# Patient Record
Sex: Male | Born: 1955
Health system: Southern US, Community
[De-identification: ages and names within clinical notes are randomized; demographics above are authoritative.]

## PROBLEM LIST (undated history)

## (undated) DIAGNOSIS — K219 Gastro-esophageal reflux disease without esophagitis: Secondary | ICD-10-CM

## (undated) DIAGNOSIS — I10 Essential (primary) hypertension: Secondary | ICD-10-CM

## (undated) DIAGNOSIS — K4090 Unilateral inguinal hernia, without obstruction or gangrene, not specified as recurrent: Secondary | ICD-10-CM

## (undated) DIAGNOSIS — Z87442 Personal history of urinary calculi: Secondary | ICD-10-CM

## (undated) DIAGNOSIS — E7800 Pure hypercholesterolemia, unspecified: Secondary | ICD-10-CM

## (undated) DIAGNOSIS — F419 Anxiety disorder, unspecified: Secondary | ICD-10-CM

## (undated) HISTORY — PX: TONSILLECTOMY: SUR1361

## (undated) HISTORY — PX: INGUINAL HERNIA REPAIR: SUR1180

## (undated) HISTORY — PX: SHOULDER SURGERY: SHX246

## (undated) HISTORY — PX: NECK SURGERY: SHX720

---

## 1999-07-15 ENCOUNTER — Encounter (HOSPITAL_BASED_OUTPATIENT_CLINIC_OR_DEPARTMENT_OTHER): Payer: Self-pay | Admitting: General Surgery

## 1999-07-16 ENCOUNTER — Ambulatory Visit (HOSPITAL_COMMUNITY): Admission: RE | Admit: 1999-07-16 | Discharge: 1999-07-16 | Payer: Self-pay | Admitting: General Surgery

## 2000-11-01 ENCOUNTER — Encounter: Payer: Self-pay | Admitting: Internal Medicine

## 2000-11-01 ENCOUNTER — Encounter: Admission: RE | Admit: 2000-11-01 | Discharge: 2000-11-01 | Payer: Self-pay | Admitting: Internal Medicine

## 2000-11-15 ENCOUNTER — Encounter: Payer: Self-pay | Admitting: Neurosurgery

## 2000-11-17 ENCOUNTER — Inpatient Hospital Stay (HOSPITAL_COMMUNITY): Admission: RE | Admit: 2000-11-17 | Discharge: 2000-11-18 | Payer: Self-pay | Admitting: Neurosurgery

## 2000-11-17 ENCOUNTER — Encounter: Payer: Self-pay | Admitting: Neurosurgery

## 2000-12-08 ENCOUNTER — Encounter: Payer: Self-pay | Admitting: Neurosurgery

## 2000-12-08 ENCOUNTER — Encounter: Admission: RE | Admit: 2000-12-08 | Discharge: 2000-12-08 | Payer: Self-pay | Admitting: Neurosurgery

## 2001-06-30 ENCOUNTER — Ambulatory Visit (HOSPITAL_COMMUNITY): Admission: RE | Admit: 2001-06-30 | Discharge: 2001-06-30 | Payer: Self-pay | Admitting: Gastroenterology

## 2005-07-02 ENCOUNTER — Ambulatory Visit (HOSPITAL_COMMUNITY): Admission: RE | Admit: 2005-07-02 | Discharge: 2005-07-02 | Payer: Self-pay | Admitting: Gastroenterology

## 2011-07-07 ENCOUNTER — Emergency Department (HOSPITAL_COMMUNITY): Payer: Worker's Compensation

## 2011-07-07 ENCOUNTER — Emergency Department (HOSPITAL_COMMUNITY)
Admission: EM | Admit: 2011-07-07 | Discharge: 2011-07-07 | Disposition: A | Discharge status: Home / Self Care | Payer: Worker's Compensation | Attending: Emergency Medicine | Admitting: Emergency Medicine

## 2011-07-07 DIAGNOSIS — I10 Essential (primary) hypertension: Secondary | ICD-10-CM | POA: Insufficient documentation

## 2011-07-07 DIAGNOSIS — S43016A Anterior dislocation of unspecified humerus, initial encounter: Secondary | ICD-10-CM | POA: Insufficient documentation

## 2011-07-07 DIAGNOSIS — Y990 Civilian activity done for income or pay: Secondary | ICD-10-CM | POA: Insufficient documentation

## 2011-07-07 DIAGNOSIS — Y9269 Other specified industrial and construction area as the place of occurrence of the external cause: Secondary | ICD-10-CM | POA: Insufficient documentation

## 2011-07-07 DIAGNOSIS — X500XXA Overexertion from strenuous movement or load, initial encounter: Secondary | ICD-10-CM | POA: Insufficient documentation

## 2011-07-11 ENCOUNTER — Emergency Department (HOSPITAL_COMMUNITY)
Admission: EM | Admit: 2011-07-11 | Discharge: 2011-07-11 | Disposition: A | Discharge status: Home / Self Care | Payer: Worker's Compensation | Attending: Emergency Medicine | Admitting: Emergency Medicine

## 2011-07-11 ENCOUNTER — Emergency Department (HOSPITAL_COMMUNITY): Payer: Worker's Compensation

## 2011-07-11 DIAGNOSIS — S43016A Anterior dislocation of unspecified humerus, initial encounter: Secondary | ICD-10-CM | POA: Insufficient documentation

## 2011-07-11 DIAGNOSIS — Y92009 Unspecified place in unspecified non-institutional (private) residence as the place of occurrence of the external cause: Secondary | ICD-10-CM | POA: Insufficient documentation

## 2011-07-11 DIAGNOSIS — X58XXXA Exposure to other specified factors, initial encounter: Secondary | ICD-10-CM | POA: Insufficient documentation

## 2011-07-11 DIAGNOSIS — I10 Essential (primary) hypertension: Secondary | ICD-10-CM | POA: Insufficient documentation

## 2014-12-25 ENCOUNTER — Encounter: Payer: Self-pay | Admitting: Sports Medicine

## 2014-12-25 ENCOUNTER — Ambulatory Visit (INDEPENDENT_AMBULATORY_CARE_PROVIDER_SITE_OTHER): Payer: 59 | Admitting: Sports Medicine

## 2014-12-25 VITALS — BP 128/83 | HR 67 | Ht 71.0 in | Wt 210.0 lb

## 2014-12-25 DIAGNOSIS — M7741 Metatarsalgia, right foot: Secondary | ICD-10-CM

## 2014-12-25 DIAGNOSIS — M7742 Metatarsalgia, left foot: Secondary | ICD-10-CM

## 2014-12-25 NOTE — Assessment & Plan Note (Signed)
I think cavus position was correct and we simply need to find the best way to support his forefoot  Metatarsal pads were added to his sports insoles and into the insoles in his boots  These were measured to support the mid and distal metatarsal shaft  After these were placed the patient did well and could walk with minimal pain   He will try this strategy for a minimum of 6 weeks If working well he can continue this  If not I would be happy to evaluate him for more aggressive treatment with orthotics

## 2014-12-25 NOTE — Progress Notes (Signed)
Patient ID: Steve Morris, male   DOB: [birth year], 59 y.o.   MRN: [id number]  Patient is referred courtesy of Dr. 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 Clelia CroftShaw felt he had metatarsalgia and sent for evaluation  Examination Pleasant gentleman in no acute distress BP 128/83 mmHg  Pulse 67  Ht 5\' 11"  (1.803 m)  Wt 210 lb (95.255 kg)  BMI 29.30 kg/m2  Foot shape is relatively neutral bilaterally Longitudinal arch is pretty well preserved Transverse arch reveals some flattening There is a mild Morton's callus on the right There is tenderness to palpation at the distal metatarsal shafts of 3 and 4 on the left and 2 and 3 on the right There is minimal splaying  Walking gait is neutral

## 2015-12-13 ENCOUNTER — Other Ambulatory Visit: Payer: Self-pay | Admitting: Family Medicine

## 2015-12-13 DIAGNOSIS — N509 Disorder of male genital organs, unspecified: Secondary | ICD-10-CM

## 2015-12-16 ENCOUNTER — Ambulatory Visit
Admission: RE | Admit: 2015-12-16 | Discharge: 2015-12-16 | Disposition: A | Discharge status: Home / Self Care | Payer: Commercial Managed Care - HMO | Source: Ambulatory Visit | Attending: Family Medicine | Admitting: Family Medicine

## 2015-12-16 DIAGNOSIS — N509 Disorder of male genital organs, unspecified: Secondary | ICD-10-CM

## 2017-02-10 DIAGNOSIS — Z125 Encounter for screening for malignant neoplasm of prostate: Secondary | ICD-10-CM | POA: Diagnosis not present

## 2017-02-10 DIAGNOSIS — I10 Essential (primary) hypertension: Secondary | ICD-10-CM | POA: Diagnosis not present

## 2017-02-10 DIAGNOSIS — Z0000 Encounter for general adult medical examination without abnormal findings: Secondary | ICD-10-CM | POA: Diagnosis not present

## 2017-03-23 DIAGNOSIS — I10 Essential (primary) hypertension: Secondary | ICD-10-CM | POA: Diagnosis not present

## 2017-03-23 DIAGNOSIS — E782 Mixed hyperlipidemia: Secondary | ICD-10-CM | POA: Diagnosis not present

## 2017-04-30 ENCOUNTER — Encounter (HOSPITAL_BASED_OUTPATIENT_CLINIC_OR_DEPARTMENT_OTHER): Payer: Self-pay | Admitting: *Deleted

## 2017-04-30 ENCOUNTER — Emergency Department (HOSPITAL_BASED_OUTPATIENT_CLINIC_OR_DEPARTMENT_OTHER): Payer: 59

## 2017-04-30 ENCOUNTER — Emergency Department (HOSPITAL_BASED_OUTPATIENT_CLINIC_OR_DEPARTMENT_OTHER)
Admission: EM | Admit: 2017-04-30 | Discharge: 2017-04-30 | Disposition: A | Discharge status: Home / Self Care | Payer: 59 | Attending: Emergency Medicine | Admitting: Emergency Medicine

## 2017-04-30 DIAGNOSIS — Z7982 Long term (current) use of aspirin: Secondary | ICD-10-CM | POA: Diagnosis not present

## 2017-04-30 DIAGNOSIS — R109 Unspecified abdominal pain: Secondary | ICD-10-CM

## 2017-04-30 DIAGNOSIS — I10 Essential (primary) hypertension: Secondary | ICD-10-CM | POA: Insufficient documentation

## 2017-04-30 DIAGNOSIS — N289 Disorder of kidney and ureter, unspecified: Secondary | ICD-10-CM | POA: Insufficient documentation

## 2017-04-30 DIAGNOSIS — Z79899 Other long term (current) drug therapy: Secondary | ICD-10-CM | POA: Insufficient documentation

## 2017-04-30 DIAGNOSIS — N201 Calculus of ureter: Secondary | ICD-10-CM | POA: Diagnosis not present

## 2017-04-30 DIAGNOSIS — R1011 Right upper quadrant pain: Secondary | ICD-10-CM | POA: Diagnosis present

## 2017-04-30 HISTORY — DX: Unilateral inguinal hernia, without obstruction or gangrene, not specified as recurrent: K40.90

## 2017-04-30 HISTORY — DX: Pure hypercholesterolemia, unspecified: E78.00

## 2017-04-30 HISTORY — DX: Anxiety disorder, unspecified: F41.9

## 2017-04-30 HISTORY — DX: Essential (primary) hypertension: I10

## 2017-04-30 LAB — CBC WITH DIFFERENTIAL/PLATELET
BASOS PCT: 0 %
Basophils Absolute: 0 10*3/uL (ref 0.0–0.1)
Eosinophils Absolute: 0.2 10*3/uL (ref 0.0–0.7)
Eosinophils Relative: 1 %
HEMATOCRIT: 43.2 % (ref 39.0–52.0)
HEMOGLOBIN: 15.4 g/dL (ref 13.0–17.0)
LYMPHS ABS: 1.9 10*3/uL (ref 0.7–4.0)
LYMPHS PCT: 15 %
MCH: 34 pg (ref 26.0–34.0)
MCHC: 35.6 g/dL (ref 30.0–36.0)
MCV: 95.4 fL (ref 78.0–100.0)
MONOS PCT: 13 %
Monocytes Absolute: 1.6 10*3/uL — ABNORMAL HIGH (ref 0.1–1.0)
NEUTROS ABS: 9.2 10*3/uL — AB (ref 1.7–7.7)
NEUTROS PCT: 71 %
Platelets: 175 10*3/uL (ref 150–400)
RBC: 4.53 MIL/uL (ref 4.22–5.81)
RDW: 12.2 % (ref 11.5–15.5)
WBC: 13 10*3/uL — ABNORMAL HIGH (ref 4.0–10.5)

## 2017-04-30 LAB — URINALYSIS, ROUTINE W REFLEX MICROSCOPIC
Bilirubin Urine: NEGATIVE
Glucose, UA: NEGATIVE mg/dL
Ketones, ur: NEGATIVE mg/dL
NITRITE: NEGATIVE
PH: 6 (ref 5.0–8.0)
Protein, ur: NEGATIVE mg/dL
SPECIFIC GRAVITY, URINE: 1.02 (ref 1.005–1.030)

## 2017-04-30 LAB — BASIC METABOLIC PANEL
Anion gap: 12 (ref 5–15)
BUN: 29 mg/dL — ABNORMAL HIGH (ref 6–20)
CHLORIDE: 100 mmol/L — AB (ref 101–111)
CO2: 24 mmol/L (ref 22–32)
CREATININE: 1.59 mg/dL — AB (ref 0.61–1.24)
Calcium: 9.4 mg/dL (ref 8.9–10.3)
GFR calc non Af Amer: 46 mL/min — ABNORMAL LOW (ref >60)
GFR, EST AFRICAN AMERICAN: 54 mL/min — AB (ref >60)
GLUCOSE: 121 mg/dL — AB (ref 65–99)
Potassium: 4.3 mmol/L (ref 3.5–5.1)
Sodium: 136 mmol/L (ref 135–145)

## 2017-04-30 LAB — URINALYSIS, MICROSCOPIC (REFLEX)

## 2017-04-30 MED ORDER — MORPHINE SULFATE (PF) 2 MG/ML IV SOLN
2.0000 mg | Freq: Once | INTRAVENOUS | Status: AC
  Administered 2017-04-30: 2 mg via INTRAVENOUS
  Filled 2017-04-30: qty 1

## 2017-04-30 MED ORDER — HYDROMORPHONE HCL 1 MG/ML IJ SOLN: 1.0000 mg | Freq: Once | INTRAMUSCULAR | Status: DC

## 2017-04-30 MED ORDER — HYDROCODONE-ACETAMINOPHEN 5-325 MG PO TABS: 1.0000 | ORAL_TABLET | Freq: Four times a day (QID) | ORAL | 0 refills | Status: DC | PRN

## 2017-04-30 MED ORDER — ONDANSETRON HCL 4 MG/2ML IJ SOLN
4.0000 mg | Freq: Once | INTRAMUSCULAR | Status: AC
  Administered 2017-04-30: 4 mg via INTRAVENOUS
  Filled 2017-04-30: qty 2

## 2017-04-30 MED ORDER — SODIUM CHLORIDE 0.9 % IV SOLN
INTRAVENOUS | Status: DC
  Administered 2017-04-30: 08:00:00 via INTRAVENOUS

## 2017-04-30 MED ORDER — ONDANSETRON 4 MG PO TBDP: 4.0000 mg | ORAL_TABLET | Freq: Three times a day (TID) | ORAL | 1 refills | Status: DC | PRN

## 2017-04-30 MED ORDER — SODIUM CHLORIDE 0.9 % IV BOLUS (SEPSIS)
500.0000 mL | Freq: Once | INTRAVENOUS | Status: AC
  Administered 2017-04-30: 500 mL via INTRAVENOUS

## 2017-04-30 MED FILL — HYDROCODON-APAP 5-325: 5-325 | 4 days supply | Qty: 20 | Fill #0

## 2017-04-30 MED FILL — ONDANSETRON ODT 4 MG TABLET: 4 | 3 days supply | Qty: 10 | Fill #0

## 2017-04-30 NOTE — Discharge Instructions (Signed)
Make an appointment to follow-up with urology. Take the pain medicine as directed. Take the Zofran as needed for nausea. Work note provided. Would expect you to pass the stone in the next 24 hours. Return for any new or worse symptoms. CT scan did confirm right-sided kidney stone.

## 2017-04-30 NOTE — ED Triage Notes (Signed)
Pt reports R back pain radiating to flank and RLQ since yesterday. Reports taking Tylenol yesterday morning with relief. Reports taking Aleve and Advil around 0200 with minimal relief. Denies known injury, n/v, fever, dysuria, hematuria.

## 2017-04-30 NOTE — ED Provider Notes (Signed)
[facility] Provider Note   CSN: [id number] Arrival date & time: 04/30/17  4098     History   Chief Complaint Chief Complaint  Patient presents with  . Back Pain    HPI Steve Morris is a 61 y.o. male.  Patient with a complaint of right flank pain. Started yesterday. At worse during the night. Pain is currently 6 out of 10. Associated with nausea no vomiting. Feeling of urinary urgency. No fevers. No pain with movement. The pain is located above the right hip and radiates into the right groin area. Patient does not have a past history of kidney stones.      Past Medical History:  Diagnosis Date  . Anxiety   . Hypercholesteremia   . Hypertension   . Inguinal hernia     Patient Active Problem List   Diagnosis Date Noted  . Metatarsalgia of both feet 12/25/2014    Past Surgical History:  Procedure Laterality Date  . INGUINAL HERNIA REPAIR    . NECK SURGERY    . SHOULDER SURGERY Right        Home Medications    Prior to Admission medications   Medication Sig Start Date End Date Taking? Authorizing Provider  ALPRAZolam Prudy Feeler) 0.5 MG tablet Take 0.5 mg by mouth 2 (two) times daily as needed. 12/12/14  Yes [provider]  aspirin 81 MG tablet Take 81 mg by mouth daily.   Yes [provider]  atorvastatin (LIPITOR) 40 MG tablet Take 40 mg by mouth daily. 10/28/14  Yes [provider]  bisoprolol-hydrochlorothiazide (ZIAC) 5-6.25 MG per tablet Take 1 tablet by mouth daily. 10/28/14  Yes [provider]  BuPROPion HCl (WELLBUTRIN PO) Take by mouth.   Yes [provider]  lisinopril (PRINIVIL,ZESTRIL) 20 MG tablet Take 20 mg by mouth daily. 10/17/14  Yes [provider]  Multiple Vitamin (MULTIVITAMIN) capsule Take 1 capsule by mouth daily.   Yes [provider]  omega-3 acid ethyl esters (LOVAZA) 1 G capsule Take by mouth 2 (two) times daily.   Yes [provider]  ranitidine  (ZANTAC) 75 MG tablet Take 75 mg by mouth 2 (two) times daily.   Yes [provider]  valACYclovir (VALTREX) 1000 MG tablet Take 1,000 mg by mouth daily as needed. 12/11/14  Yes [provider]    Family History No family history on file.  Social History Social History  Substance Use Topics  . Smoking status: Never Smoker  . Smokeless tobacco: Never Used  . Alcohol use 0.0 oz/week     Comment: daily     Allergies   Patient has no known allergies.   Review of Systems Review of Systems  Constitutional: Negative for fever.  HENT: Negative for congestion.   Eyes: Negative for visual disturbance.  Respiratory: Negative for shortness of breath.   Cardiovascular: Negative for chest pain.  Gastrointestinal: Positive for nausea. Negative for abdominal pain and vomiting.  Genitourinary: Positive for flank pain and urgency. Negative for dysuria and hematuria.  Musculoskeletal: Positive for back pain.  Skin: Negative for rash.  Neurological: Negative for headaches.  Hematological: Does not bruise/bleed easily.  Psychiatric/Behavioral: Negative for confusion.     Physical Exam Updated Vital Signs BP 133/88   Pulse 73   Temp 98.1 F (36.7 C) (Oral)   Resp 17   Ht 1.803 m (5\' 11" )   Wt 97.5 kg (215 lb)   SpO2 98%   BMI 29.99 kg/m   Physical  Exam  Constitutional: He is oriented to person, place, and time. He appears well-developed and well-nourished. No distress.  HENT:  Head: Normocephalic and atraumatic.  Mouth/Throat: Oropharynx is clear and moist.  Eyes: Pupils are equal, round, and reactive to light. Conjunctivae and EOM are normal.  Neck: Normal range of motion. Neck supple.  Cardiovascular: Normal rate, regular rhythm and normal heart sounds.   Pulmonary/Chest: Effort normal and breath sounds normal. No respiratory distress.  Abdominal: Soft. Bowel sounds are normal. There is no tenderness.  Musculoskeletal: Normal range of motion. He exhibits no  tenderness.  Neurological: He is alert and oriented to person, place, and time. No cranial nerve deficit or sensory deficit. He exhibits normal muscle tone. Coordination normal.  Skin: Skin is warm. No rash noted. No erythema.  Nursing note and vitals reviewed.    ED Treatments / Results  Labs (all labs ordered are listed, but only abnormal results are displayed) Labs Reviewed  URINALYSIS, ROUTINE W REFLEX MICROSCOPIC - Abnormal; Notable for the following:       Result Value   Hgb urine dipstick MODERATE (*)    Leukocytes, UA MODERATE (*)    All other components within normal limits  CBC WITH DIFFERENTIAL/PLATELET - Abnormal; Notable for the following:    WBC 13.0 (*)    Neutro Abs 9.2 (*)    Monocytes Absolute 1.6 (*)    All other components within normal limits  BASIC METABOLIC PANEL - Abnormal; Notable for the following:    Chloride 100 (*)    Glucose, Bld 121 (*)    BUN 29 (*)    Creatinine, Ser 1.59 (*)    GFR calc non Af Amer 46 (*)    GFR calc Af Amer 54 (*)    All other components within normal limits  URINALYSIS, MICROSCOPIC (REFLEX) - Abnormal; Notable for the following:    Bacteria, UA FEW (*)    Squamous Epithelial / LPF 0-5 (*)    All other components within normal limits    EKG  EKG Interpretation None       Radiology No results found.  Procedures Procedures (including critical care time)  Medications Ordered in ED Medications  0.9 %  sodium chloride infusion ( Intravenous New Bag/Given 04/30/17 0820)  morphine 2 MG/ML injection 2 mg (not administered)  sodium chloride 0.9 % bolus 500 mL (0 mLs Intravenous Stopped 04/30/17 0819)  ondansetron (ZOFRAN) injection 4 mg (4 mg Intravenous Given 04/30/17 0745)  morphine 2 MG/ML injection 2 mg (2 mg Intravenous Given 04/30/17 0749)     Initial Impression / Assessment and Plan / ED Course  I have reviewed the triage vital signs and the nursing notes.  Pertinent labs & imaging results that were available  during my care of the patient were reviewed by me and considered in my medical decision making (see chart for details).    CT scan does confirm a right-sided kidney stone. Patient does have some evidence of hydronephrosis. Renal function does show some evidence of renal insufficiency. Patient with improved pain control here. Patient given referral to urology for follow-up. Patient will be treated with hydrocodone and Zofran.   Final Clinical Impressions(s) / ED Diagnoses   Final diagnoses:  Flank pain, acute  Renal insufficiency    New Prescriptions New Prescriptions   No medications on file     Vanetta MuldersZackowski, Somnang Mahan, MD 04/30/17 [phone]

## 2017-04-30 NOTE — ED Notes (Signed)
Patient transported to CT 

## 2017-04-30 NOTE — ED Notes (Signed)
ED Provider at bedside. 

## 2017-05-10 DIAGNOSIS — N201 Calculus of ureter: Secondary | ICD-10-CM | POA: Diagnosis not present

## 2017-05-11 ENCOUNTER — Encounter (HOSPITAL_COMMUNITY): Payer: Self-pay | Admitting: *Deleted

## 2017-05-11 ENCOUNTER — Other Ambulatory Visit: Payer: Self-pay | Admitting: Urology

## 2017-05-14 NOTE — H&P (Signed)
Office Visit Report     05/10/2017   --------------------------------------------------------------------------------   Steve Morris  MRN: [id number]  PRIMARY CARE:  Kyra Searles. Brigitte Pulse, MD  DOB: Jul 13, 1956, 61 year old Male  REFERRING:    SSN: [id number]  PROVIDER:  Irine Seal, M.D.    LOCATION:  [facility] [phone]   --------------------------------------------------------------------------------   CC: I have ureteral stone.  HPI: Steve Morris is a 61 year-old male patient who is here for ureteral stone.  The problem is on the right side. He first stated noticing pain on 05/06/2017. This is his first kidney stone. He is not currently having flank pain, back pain, groin pain, nausea, vomiting, fever or chills. He has not caught a stone in his urine strainer since his symptoms began.   He has never had surgical treatment for calculi in the past.   Steve Morris is a 61 yo WM who was seen in the ER on Friday for right flank pain that began on Thursday. The pain was severe with nausea and vomiting. He had no gross hematuria. He was found to have a 6x25m right proximal stone with obstruction. He has minimal pain now but when he last had pain it was in the suprapubic area. He has no urgency or frequency except from fluid intake. he has no prior history of stones or GU surgery. He has bilateral renal stones with a 1cm LLP stone.      ALLERGIES: None   MEDICATIONS: Hydrochlorothiazide  Lipitor  Lisinopril  Aspir 81  Fish Oil  Multivitamin  Wellbutrin Sr     GU PSH: None   NON-GU PSH: Hernia Repair, Right Neck Surgery (Unspecified) Shoulder Surgery (Unspecified), Right    GU PMH: None   NON-GU PMH: Hypercholesterolemia Hypertension    FAMILY HISTORY: None   SOCIAL HISTORY: Marital Status: Married Preferred Language: English; Race: White Current Smoking Status: Patient has never smoked.   Tobacco Use Assessment Completed: Used Tobacco in last 30  days? Drinks 1 caffeinated drink per day.    REVIEW OF SYSTEMS:    GU Review Male:   Patient reports leakage of urine. Patient denies frequent urination, hard to postpone urination, burning/ pain with urination, get up at night to urinate, stream starts and stops, trouble starting your stream, have to strain to urinate , erection problems, and penile pain.  Gastrointestinal (Upper):   Patient denies nausea, vomiting, and indigestion/ heartburn.  Gastrointestinal (Lower):   Patient denies diarrhea and constipation.  Constitutional:   Patient denies fever, night sweats, weight loss, and fatigue.  Skin:   Patient denies skin rash/ lesion and itching.  Eyes:   Patient denies blurred vision and double vision.  Ears/ Nose/ Throat:   Patient reports sinus problems. Patient denies sore throat.  Hematologic/Lymphatic:   Patient denies swollen glands and easy bruising.  Cardiovascular:   Patient denies leg swelling and chest pains.  Respiratory:   Patient denies cough and shortness of breath.  Endocrine:   Patient denies excessive thirst.  Musculoskeletal:   Patient reports back pain. Patient denies joint pain.  Neurological:   Patient denies headaches and dizziness.  Psychologic:   Patient denies depression and anxiety.   VITAL SIGNS:      05/10/2017 01:18 PM  Weight 215 lb / 97.52 kg  Height 71 in / 180.34 cm  BP 119/75 mmHg  Heart Rate 72 /min  BMI 30.0 kg/m   MULTI-SYSTEM PHYSICAL EXAMINATION:    Constitutional: Well-nourished. No physical deformities. Normally  developed. Good grooming.  Neck: Neck symmetrical, not swollen. Normal tracheal position.  Respiratory: No labored breathing, no use of accessory muscles. CTA  Cardiovascular: Normal temperature, RRR without murmur.  Lymphatic: No enlargement of neck, axillae, groin.  Skin: No paleness, no jaundice, no cyanosis. No lesion, no ulcer, no rash.  Neurologic / Psychiatric: Oriented to time, oriented to place, oriented to person. No  depression, no anxiety, no agitation.  Gastrointestinal: Obese abdomen. No mass, no tenderness, no rigidity.   Musculoskeletal: Normal gait and station of head and neck.     PAST DATA REVIEWED:  Source Of History:  Patient  Urine Test Review:   Urinalysis  X-Ray Review: C.T. Stone Protocol: Reviewed Films. Reviewed Report. Discussed With Patient.     PROCEDURES:         KUB - [id number]  A single view of the abdomen is obtained. he has a 6x67m stone adjacent to L4 on the right and a 1cm stone in the LLP. There is a right pelvic phlebolith that is 452m No bone, gas or soft tissue abnormalities are noted.                Urinalysis Dipstick Dipstick Cont'd  Color: Yellow Bilirubin: Neg  Appearance: Clear Ketones: Neg  Specific Gravity: 1.020 Blood: Neg  pH: 6.0 Protein: Neg  Glucose: Neg Urobilinogen: 0.2    Nitrites: Neg    Leukocyte Esterase: Neg    ASSESSMENT:      ICD-10 Details  1 GU:   Ureteral calculus - N20.1 He has a 6x5m26might proximal stone that has not progressed since 7/23. I discussed MET, ESWL and URS and he would like to set up ESWL on 7/30. He has important work this weekend. I have reviewed the risks of bleeding, infection, ureteral and adjacent organ injury, failure of fragmentation, need for secondary procedures, thrombotic event and sedation risks. I have given him vicodin and tamsulosin.    PLAN:            Medications New Meds: Tamsulosin Hcl 0.4 mg capsule, ext release 24 hr 1 capsule PO Daily   #30  0 Refill(s)  Hydrocodone-Acetaminophen 5 mg-325 mg tablet 1 tablet PO Q 4 H PRN   #15  0 Refill(s)            Orders X-Rays: KUB          Schedule Return Visit/Planned Activity: Next Available Appointment - Schedule Surgery             Note: ESWL on 7/30          Document Letter(s):  Created for Patient: Clinical Summary         Notes:   CC: Dr. KimMayra Neer * Signed by JohIrine Seal.D. on 05/10/17 at 2:01 PM (EDT)*

## 2017-05-17 ENCOUNTER — Ambulatory Visit (HOSPITAL_COMMUNITY)
Admission: RE | Admit: 2017-05-17 | Discharge: 2017-05-17 | Disposition: A | Discharge status: Home / Self Care | Payer: 59 | Source: Ambulatory Visit | Attending: Urology | Admitting: Urology

## 2017-05-17 ENCOUNTER — Ambulatory Visit (HOSPITAL_COMMUNITY): Payer: 59

## 2017-05-17 ENCOUNTER — Encounter (HOSPITAL_COMMUNITY)
Admission: RE | Disposition: A | Discharge status: Home / Self Care | Payer: Self-pay | Source: Ambulatory Visit | Attending: Urology

## 2017-05-17 ENCOUNTER — Encounter (HOSPITAL_COMMUNITY): Payer: Self-pay | Admitting: General Practice

## 2017-05-17 DIAGNOSIS — Z9889 Other specified postprocedural states: Secondary | ICD-10-CM | POA: Diagnosis not present

## 2017-05-17 DIAGNOSIS — E7800 Pure hypercholesterolemia, unspecified: Secondary | ICD-10-CM | POA: Diagnosis not present

## 2017-05-17 DIAGNOSIS — N201 Calculus of ureter: Secondary | ICD-10-CM | POA: Insufficient documentation

## 2017-05-17 DIAGNOSIS — Z79899 Other long term (current) drug therapy: Secondary | ICD-10-CM | POA: Insufficient documentation

## 2017-05-17 DIAGNOSIS — I10 Essential (primary) hypertension: Secondary | ICD-10-CM | POA: Diagnosis not present

## 2017-05-17 DIAGNOSIS — Z7982 Long term (current) use of aspirin: Secondary | ICD-10-CM | POA: Diagnosis not present

## 2017-05-17 DIAGNOSIS — N200 Calculus of kidney: Secondary | ICD-10-CM

## 2017-05-17 HISTORY — DX: Personal history of urinary calculi: Z87.442

## 2017-05-17 HISTORY — PX: EXTRACORPOREAL SHOCK WAVE LITHOTRIPSY: SHX1557

## 2017-05-17 SURGERY — LITHOTRIPSY, ESWL
Anesthesia: LOCAL | Laterality: Right

## 2017-05-17 MED ORDER — SODIUM CHLORIDE 0.9 % IV SOLN
INTRAVENOUS | Status: DC
  Administered 2017-05-17: 07:00:00 via INTRAVENOUS

## 2017-05-17 MED ORDER — DIPHENHYDRAMINE HCL 25 MG PO CAPS
25.0000 mg | ORAL_CAPSULE | ORAL | Status: AC
  Administered 2017-05-17: 25 mg via ORAL
  Filled 2017-05-17: qty 1

## 2017-05-17 MED ORDER — DIAZEPAM 5 MG PO TABS
10.0000 mg | ORAL_TABLET | ORAL | Status: AC
  Administered 2017-05-17: 10 mg via ORAL
  Filled 2017-05-17: qty 2

## 2017-05-17 MED ORDER — CIPROFLOXACIN HCL 500 MG PO TABS
500.0000 mg | ORAL_TABLET | ORAL | Status: AC
  Administered 2017-05-17: 500 mg via ORAL
  Filled 2017-05-17: qty 1

## 2017-05-17 NOTE — Discharge Instructions (Signed)
1. You should strain your urine and collect all fragments and bring them to your follow up appointment.  °2. You should take your pain medication as needed.  Please call if your pain is severe to the point that it is not controlled with your pain medication. °3. You should call if you develop fever > 101 or persistent nausea or vomiting. °4. Your doctor may prescribe tamsulosin to take to help facilitate stone passage. °

## 2017-05-17 NOTE — Op Note (Signed)
See scanned chart for ESWL operative note. 

## 2017-05-17 NOTE — Interval H&P Note (Signed)
History and Physical Interval Note:  05/17/2017 6:59 AM  Steve Morris  has presented today for surgery, with the diagnosis of RIGHT PROXIMAL STONE  The various methods of treatment have been discussed with the patient and family. After consideration of risks, benefits and other options for treatment, the patient has consented to  Procedure(s): RIGHT EXTRACORPOREAL SHOCK WAVE LITHOTRIPSY (ESWL) (Right) as a surgical intervention .  The patient's history has been reviewed, patient examined, no change in status, stable for surgery.  I have reviewed the patient's chart and labs.  Questions were answered to the patient's satisfaction.     Salah Burlison,LES

## 2017-05-18 ENCOUNTER — Encounter (HOSPITAL_COMMUNITY): Payer: Self-pay | Admitting: Urology

## 2017-06-01 DIAGNOSIS — N202 Calculus of kidney with calculus of ureter: Secondary | ICD-10-CM | POA: Diagnosis not present

## 2017-06-01 DIAGNOSIS — N201 Calculus of ureter: Secondary | ICD-10-CM | POA: Diagnosis not present

## 2017-06-28 DIAGNOSIS — M542 Cervicalgia: Secondary | ICD-10-CM | POA: Diagnosis not present

## 2017-06-28 DIAGNOSIS — M19011 Primary osteoarthritis, right shoulder: Secondary | ICD-10-CM | POA: Diagnosis not present

## 2017-07-13 DIAGNOSIS — Z87442 Personal history of urinary calculi: Secondary | ICD-10-CM | POA: Diagnosis not present

## 2017-09-13 DIAGNOSIS — Z23 Encounter for immunization: Secondary | ICD-10-CM | POA: Diagnosis not present

## 2017-09-13 DIAGNOSIS — M7741 Metatarsalgia, right foot: Secondary | ICD-10-CM | POA: Diagnosis not present

## 2017-09-27 ENCOUNTER — Ambulatory Visit: Payer: Self-pay | Admitting: Podiatry

## 2017-09-29 ENCOUNTER — Encounter: Payer: Self-pay | Admitting: Podiatry

## 2017-09-29 ENCOUNTER — Ambulatory Visit (INDEPENDENT_AMBULATORY_CARE_PROVIDER_SITE_OTHER): Payer: 59

## 2017-09-29 ENCOUNTER — Ambulatory Visit: Payer: 59 | Admitting: Podiatry

## 2017-09-29 VITALS — Ht 71.0 in | Wt 215.0 lb

## 2017-09-29 DIAGNOSIS — M722 Plantar fascial fibromatosis: Secondary | ICD-10-CM

## 2017-09-29 MED ORDER — DICLOFENAC SODIUM 75 MG PO TBEC: 75.0000 mg | DELAYED_RELEASE_TABLET | Freq: Two times a day (BID) | ORAL | 1 refills | Status: DC

## 2017-09-29 NOTE — Progress Notes (Signed)
Patient ID: Steve Morris, male   DOB: [birth year], 61 y.o.   MRN: [id number]   HPI: 61 year old male presents the office today for bilateral foot pain.  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  He walks approximately 8-10 miles per day.  He states that he was seen several years ago by Dr. Darrick PennaFields who recommended he begin wearing metatarsal pads.  Patient states that the metatarsal pads are now causing soreness with pain to the plantar arches of his bilateral feet with his right worse than the left.  He presents today for further treatment evaluation  Past Medical History:  Diagnosis Date  . Anxiety   . History of kidney stones   . Hypercholesteremia   . Hypertension   . Inguinal hernia      Physical Exam: General: The patient is alert and oriented x3 in no acute distress.  Dermatology: Skin is warm, dry and supple bilateral lower extremities. Negative for open lesions or macerations.  Vascular: Palpable pedal pulses bilaterally. No edema or erythema noted. Capillary refill within normal limits.  Neurological: Epicritic and protective threshold grossly intact bilaterally.   Musculoskeletal Exam: Range of motion within normal limits to all pedal and ankle joints bilateral. Muscle strength 5/5 in all groups bilateral.  There is some pain on palpation along the plantar fascia mid substance along the medial longitudinal arch of bilateral feet  Radiographic Exam:  Normal osseous mineralization. Joint spaces preserved. No fracture/dislocation/boney destruction.    Assessment: -   Plantar fasciitis bilateral-mid substance   Plan of Care:  -Patient evaluated today.  X-rays reviewed today. -Recommend that the patient discontinue wearing the metatarsal pads that they are aggravating the pain -Continue wearing Ecco work boots -Today the patient was molded for custom molded orthotics by Raiford Nobleick, Pedorthist -Return to clinic in 3-4 weeks  for orthotics pick up -prescription for diclofenac 75 mg   Felecia ShellingBrent M. Evans, DPM [facility]  Dr. Felecia ShellingBrent M. Evans, DPM    [address]                                        [place], [place] [address]                Office [phone]  Fax [phone]

## 2017-09-29 NOTE — Progress Notes (Signed)
   Subjective:    Patient ID: Steve Morris, male    DOB: 07-Oct-1956, 61 y.o.   MRN: [id number]  HPI  Chief Complaint  Patient presents with  . Foot Pain    bilateral -x 6 months or more - heel and arch pain, especially right. 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      Objective:   Physical Exam        Assessment & Plan:

## 2017-09-29 NOTE — Patient Instructions (Signed)

## 2017-10-01 ENCOUNTER — Telehealth: Payer: Self-pay | Admitting: Podiatry

## 2017-10-01 NOTE — Telephone Encounter (Signed)
Called pt with orthotic benefit information and pt is aware he will have a 20% co insurance and wishes to proceed with ordering them.

## 2017-10-25 ENCOUNTER — Ambulatory Visit: Payer: 59 | Admitting: Orthotics

## 2017-10-25 DIAGNOSIS — M722 Plantar fascial fibromatosis: Secondary | ICD-10-CM

## 2017-10-25 NOTE — Progress Notes (Signed)
Patient came in today to pick up custom made foot orthotics.  The goals were accomplished and the patient reported no dissatisfaction with said orthotics.  Patient was advised of breakin period and how to report any issues. 

## 2018-01-19 IMAGING — CT CT RENAL STONE PROTOCOL
2 of 4 series · 14 of 46 positions shown, 16 images · non-contrast
Comparison: None.

CLINICAL DATA: Right flank pain

EXAM:
CT ABDOMEN AND PELVIS WITHOUT CONTRAST
TECHNIQUE: Multidetector CT imaging of the abdomen and pelvis was performed
following the standard protocol without oral or intravenous contrast
material administration.

[Series 2: axial st · axial · 0.98mm/px · z∈[-560,-45]mm · 11 of 123 slices shown, 13 images]
[im 10/123  soft-tissue]
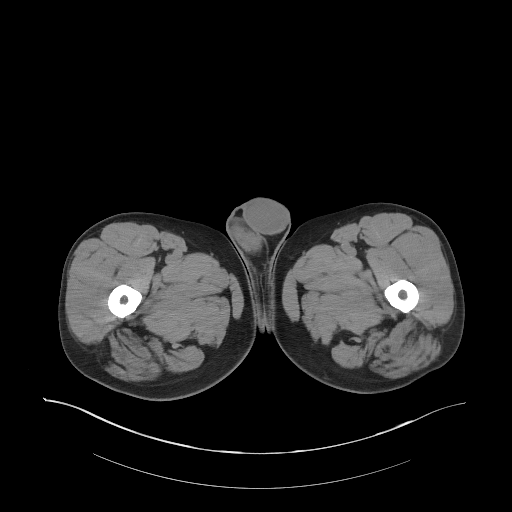
[im 10/123  bone]
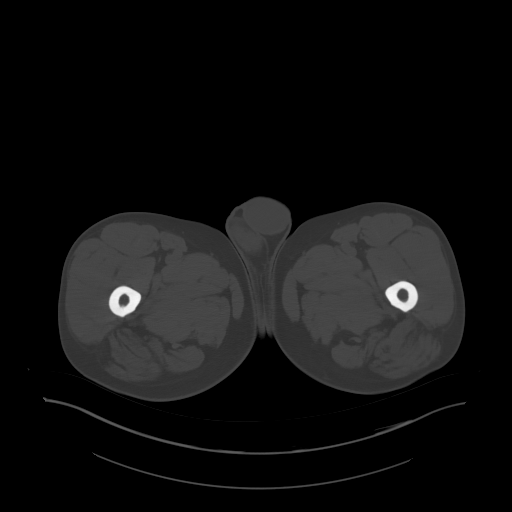
[im 20/123  soft-tissue]
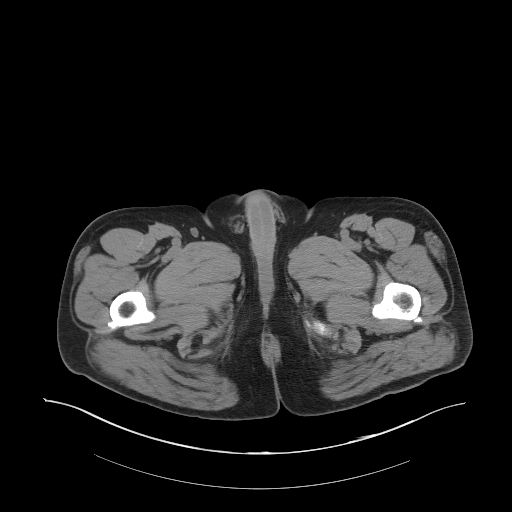
[im 30/123  soft-tissue]
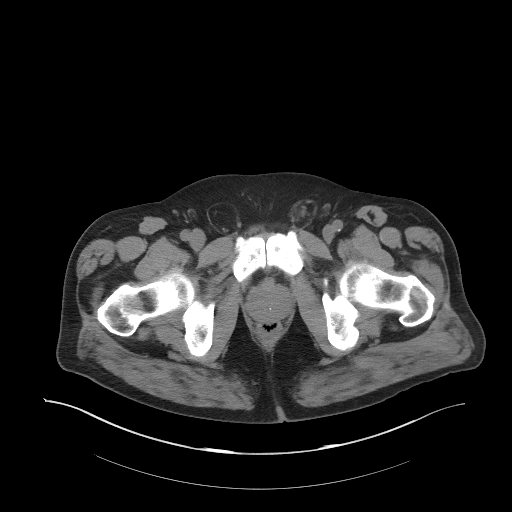
[im 40/123  soft-tissue]
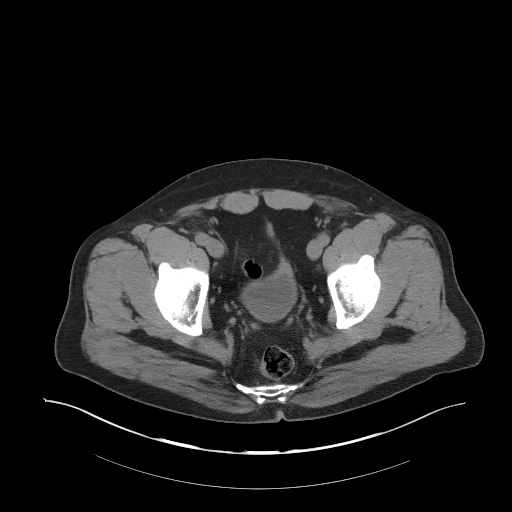
[im 49/123  soft-tissue]
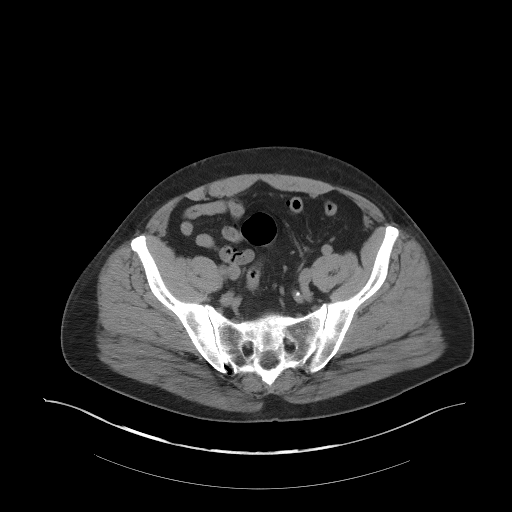
[im 64/123  soft-tissue]
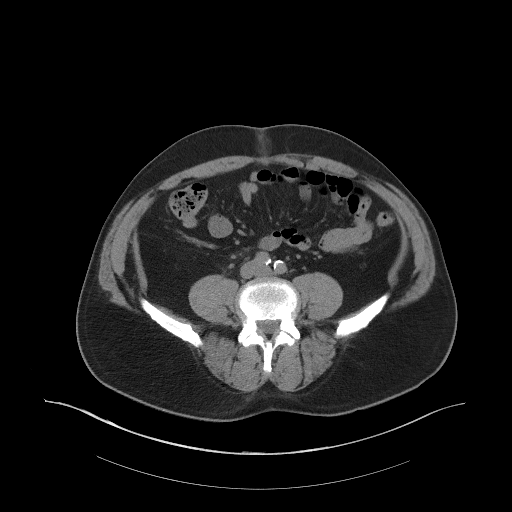
[im 74/123  soft-tissue]
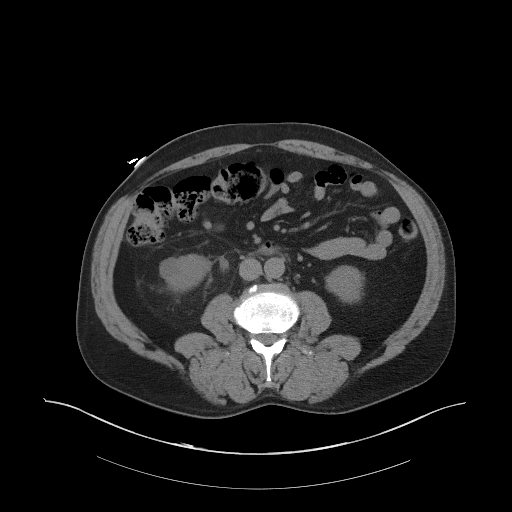
[im 83/123  soft-tissue]
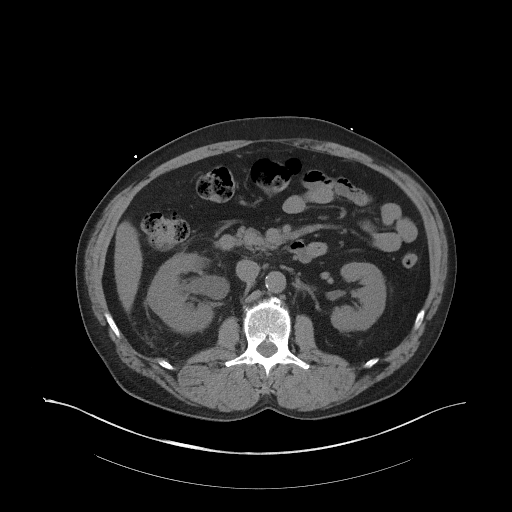
[im 93/123  soft-tissue]
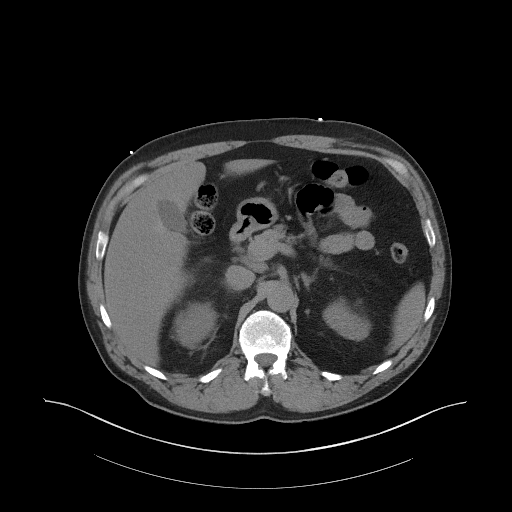
[im 93/123  bone]
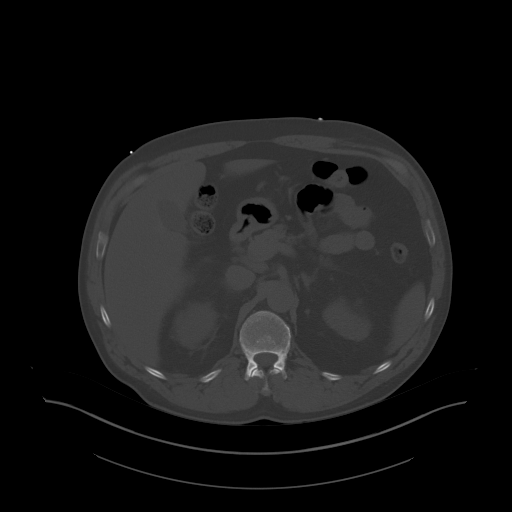
[im 103/123  soft-tissue]
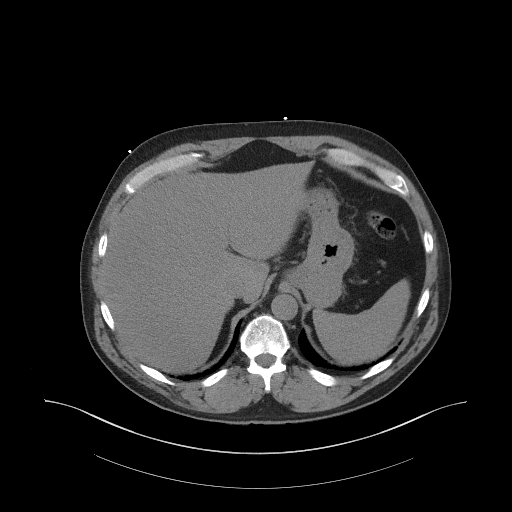
[im 113/123  soft-tissue]
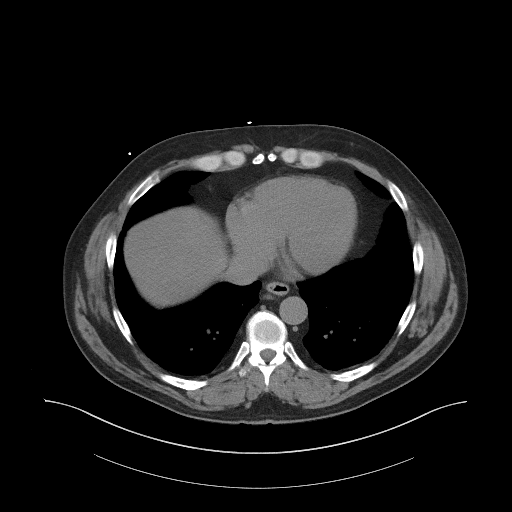

[Series 4: coronal st · coronal · 0.93mm/px · 3 of 112 slices shown]
[im 38/112  soft-tissue]
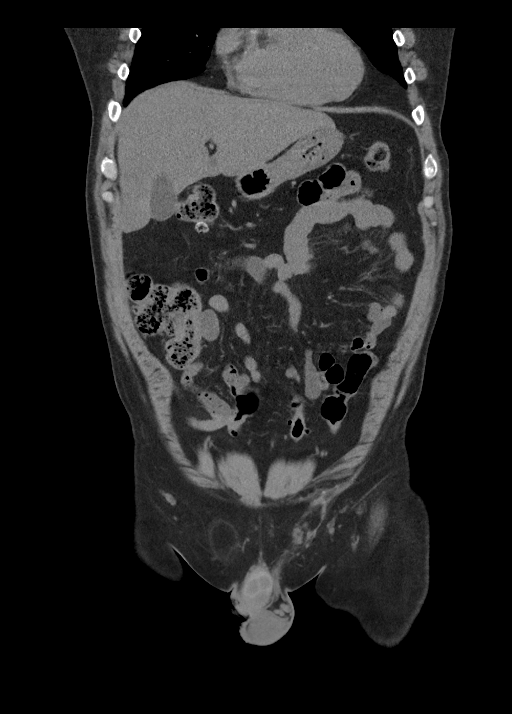
[im 50/112  soft-tissue]
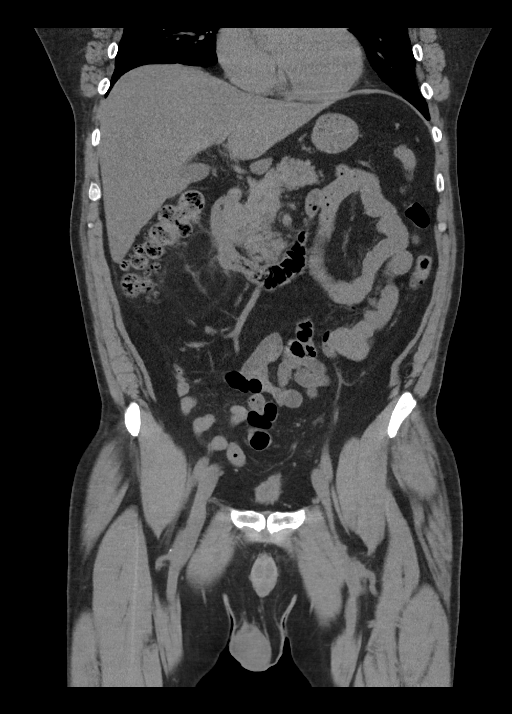
[im 62/112  soft-tissue]
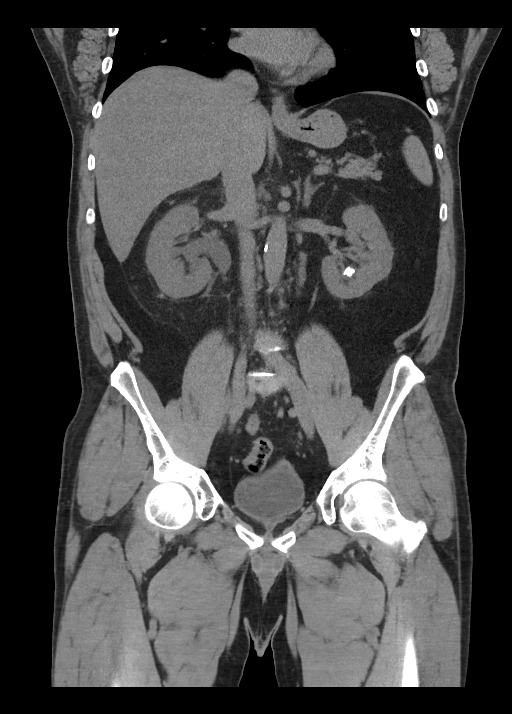

[14 of 46 positions shown; findings below may reference images not displayed]

FINDINGS: Lower chest: There is patchy bibasilar atelectasis, slightly more on
the left than on the right.

Hepatobiliary: No focal liver lesions are appreciable on this
noncontrast enhanced study. Gallbladder wall is not appreciably
thickened. There is no biliary duct dilatation.

Pancreas: There is no pancreatic mass or inflammatory focus.

Spleen: No splenic lesions are evident.

Adrenals/Urinary Tract: Adrenals appear normal bilaterally. There is
moderate right perinephric edema with stranding in the perinephric
fascia on the right. There is a cyst in the anterior mid right
kidney measuring 1.3 x 1.2 cm. There is a cyst in the lateral lower
pole region on the right measuring 2.0 x 1.6 cm. There is moderate
hydronephrosis on the right. There is no appreciable hydronephrosis
on the left. On the right, there is a 1 mm calculus in the upper
pole region. There is a 3 x 1 mm calculus in the posterior mid right
kidney. There is a 2 x 1 mm calculus in the anterior mid right
kidney. There is a 1 mm calculus in the lower pole posteriorly as
well as a 1 mm calculus in the lower pole anteriorly on the right.
On the left, there is a 1 mm calculus in the posterior upper to mid
kidney. There is a 1 mm calculus more peripherally in the mid
kidney. In the lower pole region, there is a 6 x 5 mm calculus with
an adjacent 3 x 3 mm calculus and a second adjacent calculus
measuring 2 x 1 mm. On the right, there is a calculus in the
proximal right ureter at the level of L4 measuring 6 x 5 mm. No
other ureteral calculi are evident. Urinary bladder is midline with
wall thickness within normal limits.

Stomach/Bowel: There is no appreciable bowel wall or mesenteric
thickening. There is no bowel obstruction. No free air or portal
venous air.

Vascular/Lymphatic: There is calcification in the aorta, common
iliac, and proximal renal arteries bilaterally. No aneurysm evident.
No mesenteric vessel obstruction is appreciable on this noncontrast
enhanced study. There is no adenopathy appreciable in the abdomen or
pelvis.

Reproductive: There are multiple small prostatic calculi. Prostate
and seminal vesicles appear normal in size and contour. No pelvic
mass evident.

Other: Appendix appears normal. There is no ascites or abscess in
the abdomen or pelvis. There is a small ventral hernia containing
only fat. There is fat in each inguinal ring.

Musculoskeletal: There is degenerative change in the lumbar spine,
most notably at L5-S1. There is broad-based disc protrusion at L4-5
causing borderline spinal stenosis. There are no blastic or lytic
bone lesions. No intramuscular or abdominal wall lesion evident.
IMPRESSION: 1. 6 x 5 mm calculus in the right ureter at the level of L4 causing
moderate hydronephrosis and proximal ureterectasis on the right.
There is moderate perinephric edema on the right.

2. Nonobstructing intrarenal calculi bilaterally, more on the left
than on the right. Multiple prostatic calculi noted.

3.  No bowel obstruction.  No abscess.  Appendix appears normal.

4. Small ventral hernia containing only fat. Fat noted in each
inguinal ring.

5.  There is aortoiliac atherosclerosis.

Aortic Atherosclerosis (3356C-2FR.R).

## 2018-02-05 IMAGING — CR DG ABDOMEN 1V
2 series · 2 of 2 positions shown · non-contrast
Comparison: 05/10/2017.  CT 04/30/2017 .

CLINICAL DATA: Lithotripsy.

EXAM:
ABDOMEN - 1 VIEW

[t abdomen supine (1 of 2)]
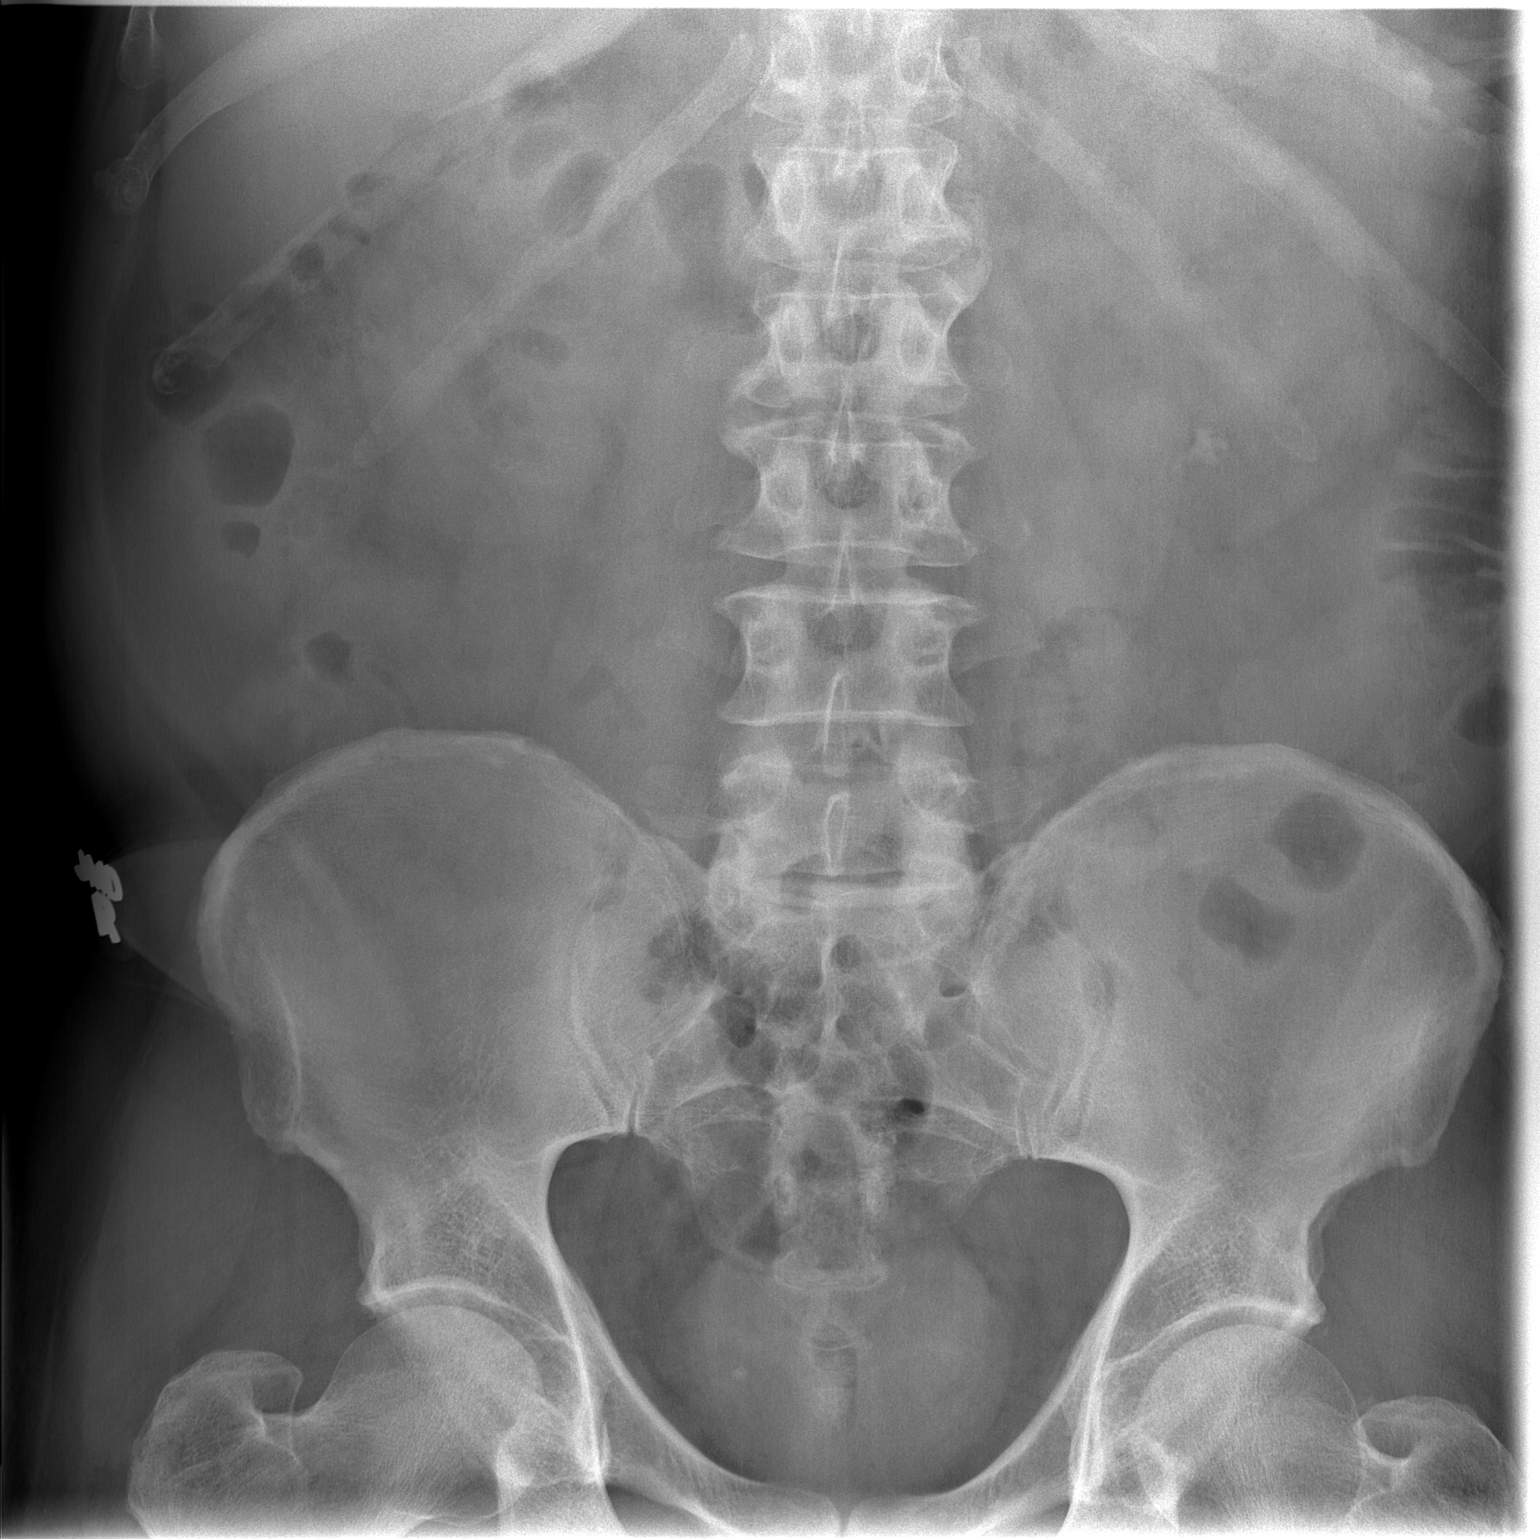

[t abdomen supine (2 of 2)]
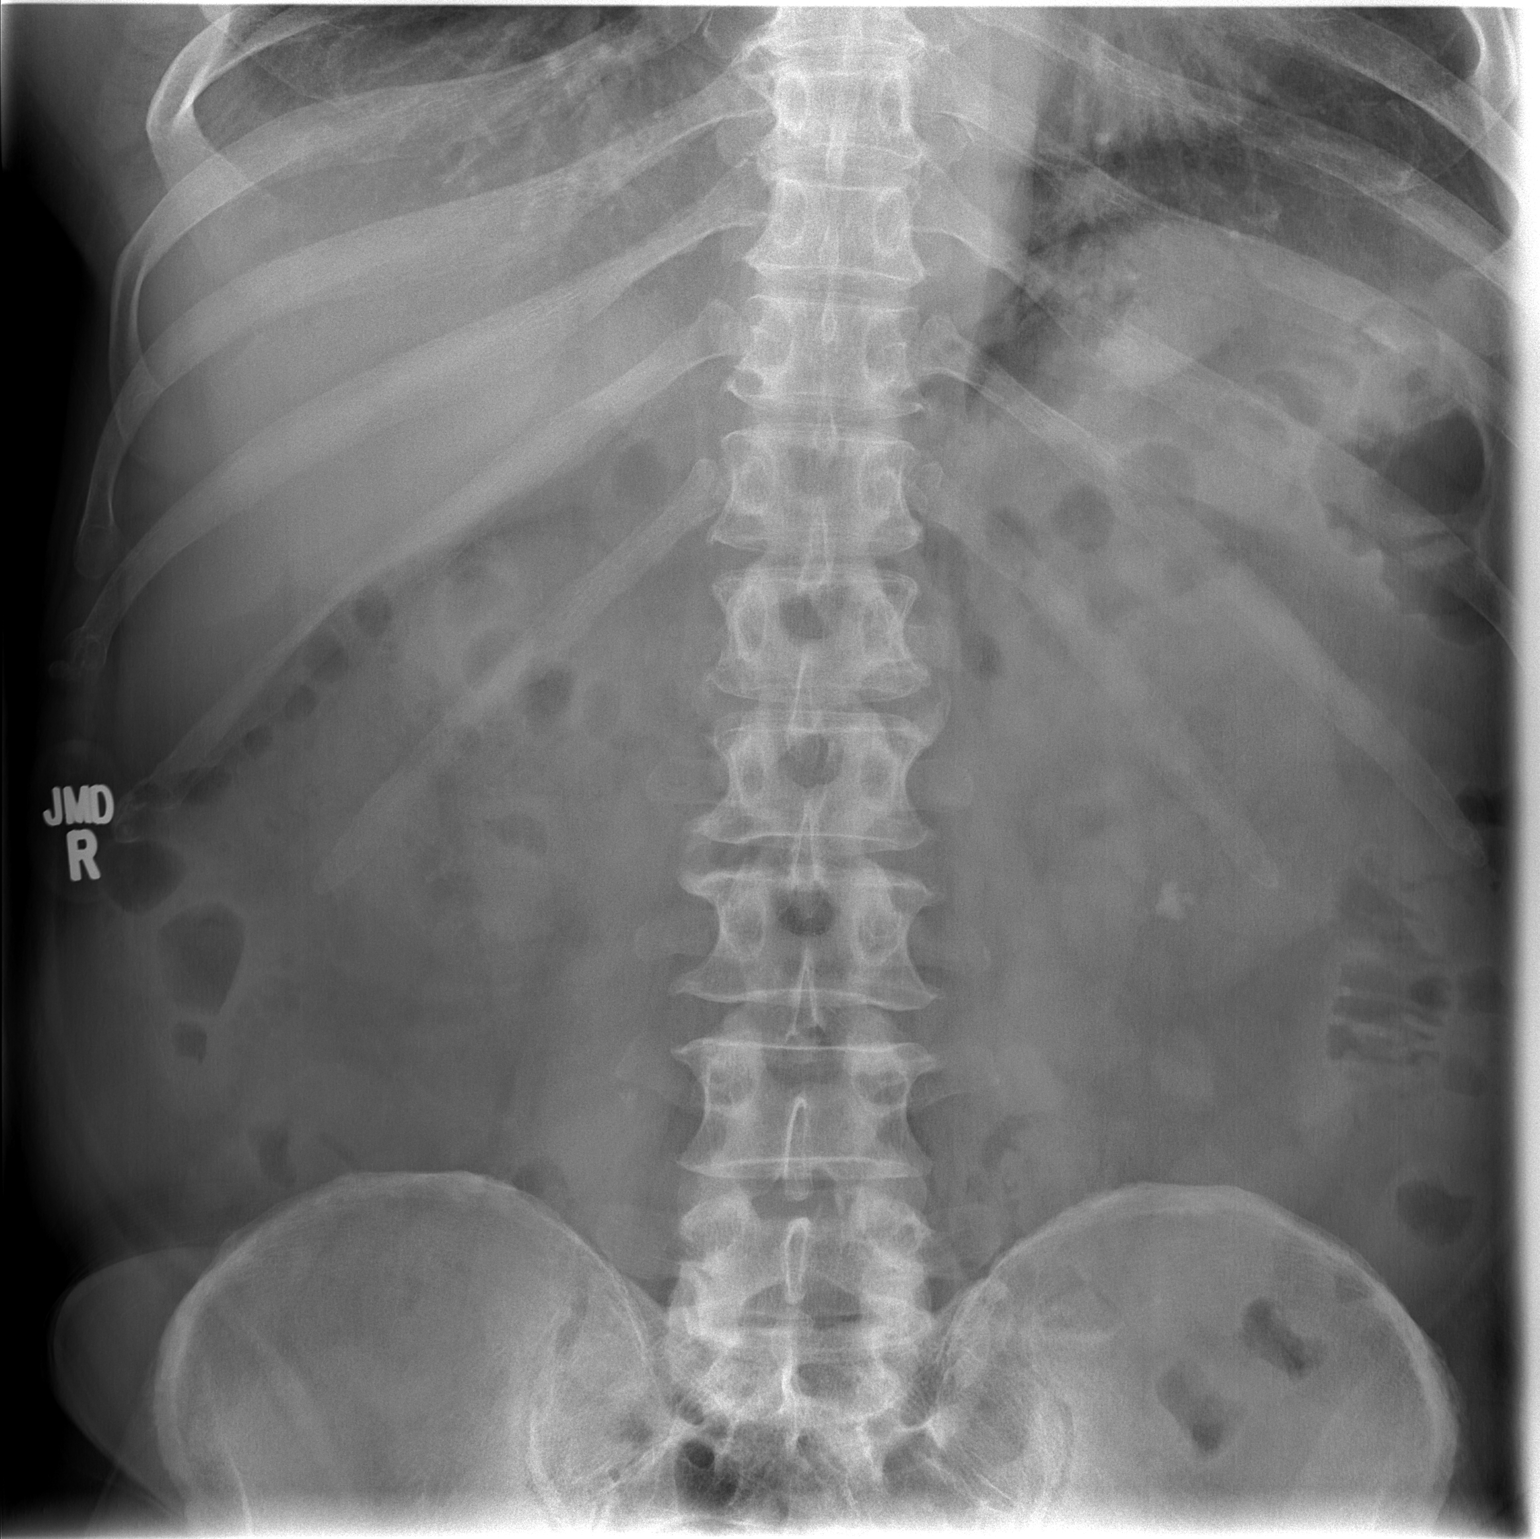

[2 of 2 positions shown; findings below may reference images not displayed]

FINDINGS: Soft tissue structures are unremarkable. Previously identified right
mid ureteral stone no longer identified over the right ureter.
Stable calcific density is noted projected over the right lower
pelvis consistent with phleboliths as previously noted on prior CT
of 04/30/2017. Persistent left nephrolithiasis, unchanged. No bowel
distention. No acute bony abnormality.
IMPRESSION: Previously identified right mid ureteral stone no longer identified
over the right ureter. This suggest interval passage.

These results will be called to the ordering clinician or
representative by the Radiologist Assistant, and communication
documented in the PACS or zVision Dashboard.

## 2018-03-08 DIAGNOSIS — Z0000 Encounter for general adult medical examination without abnormal findings: Secondary | ICD-10-CM | POA: Diagnosis not present

## 2018-03-08 DIAGNOSIS — E782 Mixed hyperlipidemia: Secondary | ICD-10-CM | POA: Diagnosis not present

## 2018-03-08 DIAGNOSIS — Z125 Encounter for screening for malignant neoplasm of prostate: Secondary | ICD-10-CM | POA: Diagnosis not present

## 2018-03-08 DIAGNOSIS — I10 Essential (primary) hypertension: Secondary | ICD-10-CM | POA: Diagnosis not present

## 2018-08-03 DIAGNOSIS — Z23 Encounter for immunization: Secondary | ICD-10-CM | POA: Diagnosis not present

## 2019-10-05 ENCOUNTER — Other Ambulatory Visit: Payer: Self-pay | Admitting: Family Medicine

## 2019-10-05 DIAGNOSIS — R7989 Other specified abnormal findings of blood chemistry: Secondary | ICD-10-CM

## 2019-10-05 DIAGNOSIS — R945 Abnormal results of liver function studies: Secondary | ICD-10-CM

## 2019-10-11 ENCOUNTER — Other Ambulatory Visit: Payer: Worker's Compensation

## 2019-10-24 ENCOUNTER — Ambulatory Visit
Admission: RE | Admit: 2019-10-24 | Discharge: 2019-10-24 | Disposition: A | Discharge status: Home / Self Care | Payer: 59 | Source: Ambulatory Visit | Attending: Family Medicine | Admitting: Family Medicine

## 2019-10-24 DIAGNOSIS — R945 Abnormal results of liver function studies: Secondary | ICD-10-CM

## 2019-10-24 DIAGNOSIS — R7989 Other specified abnormal findings of blood chemistry: Secondary | ICD-10-CM

## 2020-07-14 IMAGING — US US ABDOMEN LIMITED
1 series · 14 of 25 positions shown · non-contrast
Comparison: CT abdomen/pelvis 04/30/2017

CLINICAL DATA: Abnormal liver function test.

EXAM:
ULTRASOUND ABDOMEN LIMITED RIGHT UPPER QUADRANT

[Series 1: us abdomen limited · 0.21mm/px · 14 of 50 slices shown]
[im 1/50]
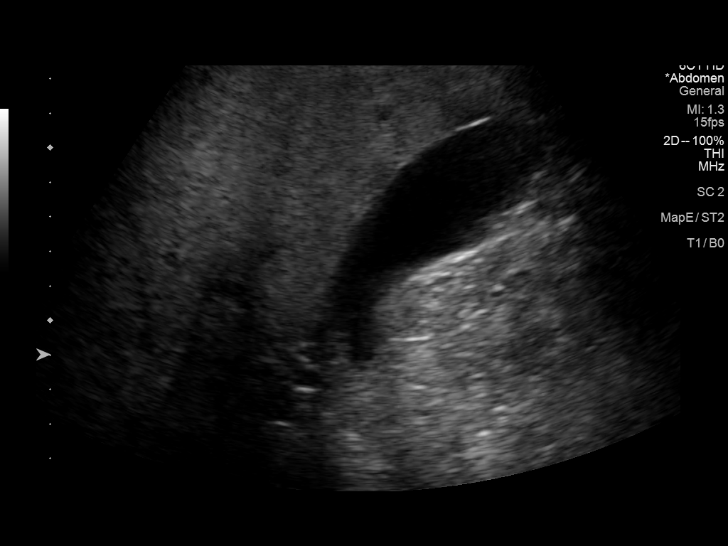
[im 5/50]
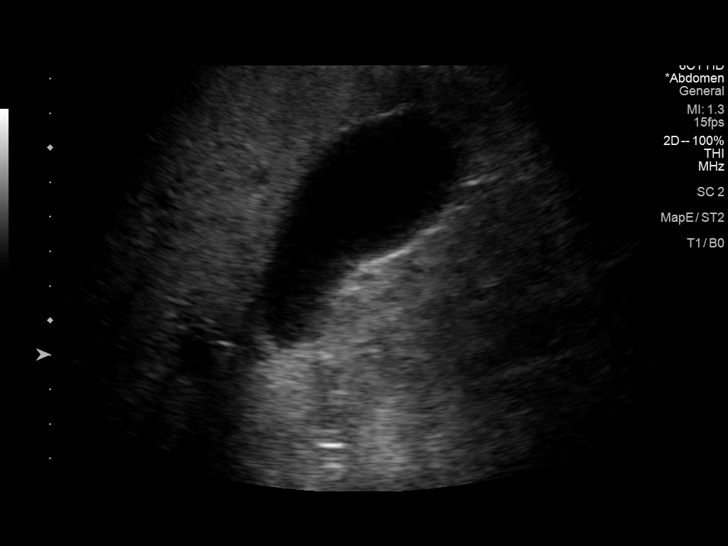
[im 9/50]
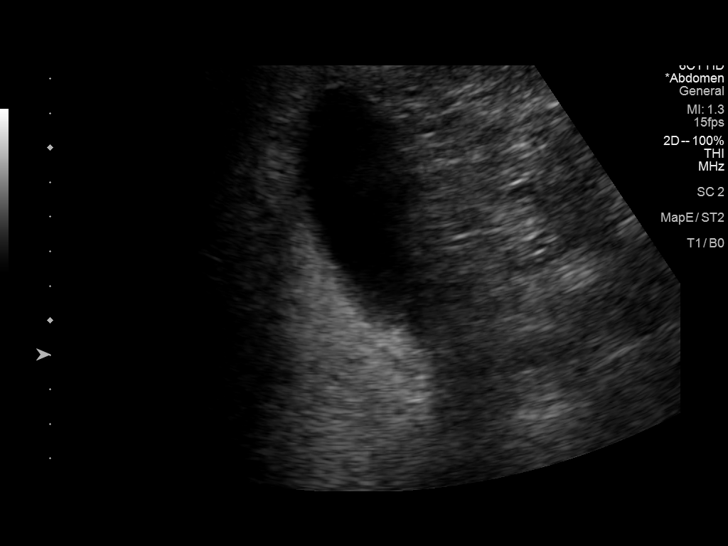
[im 13/50]
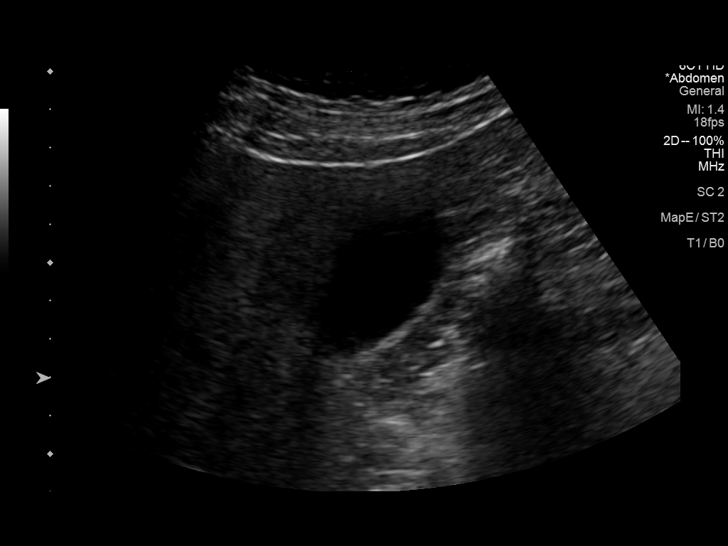
[im 17/50]
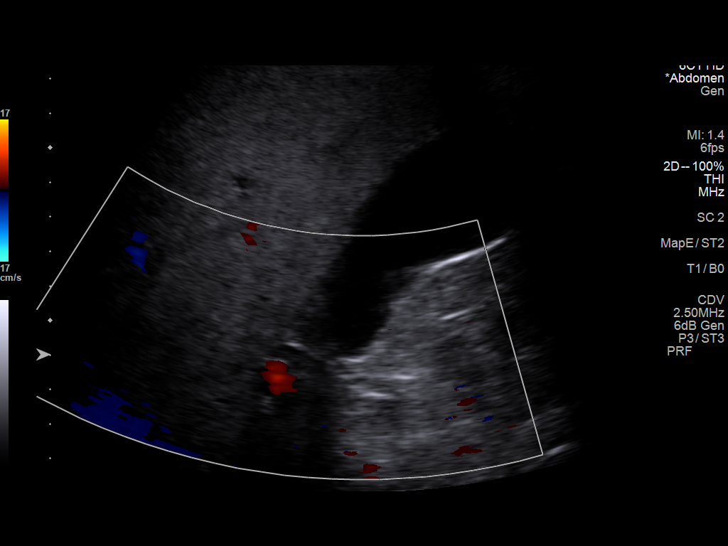
[im 19/50]
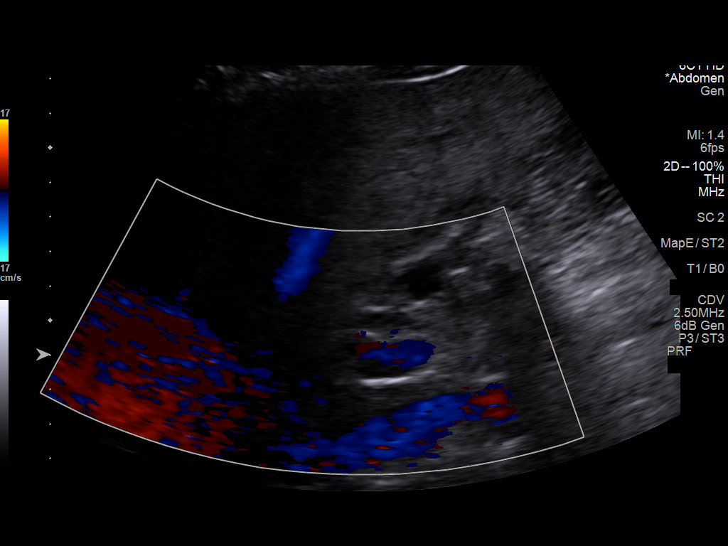
[im 23/50]
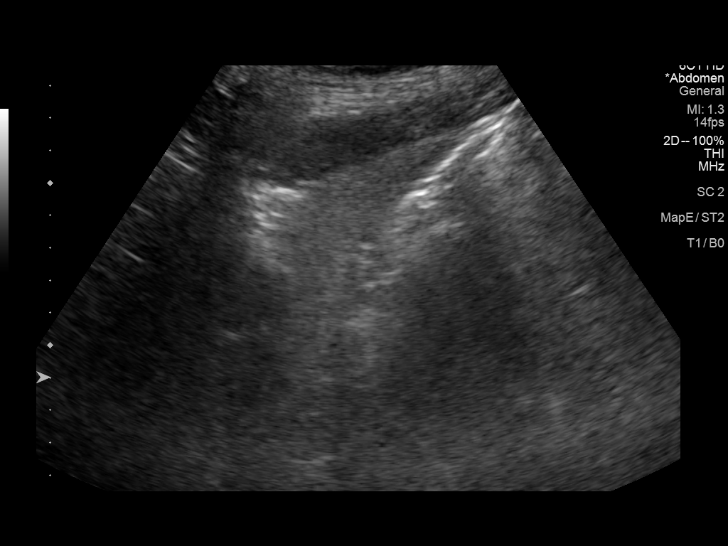
[im 27/50]
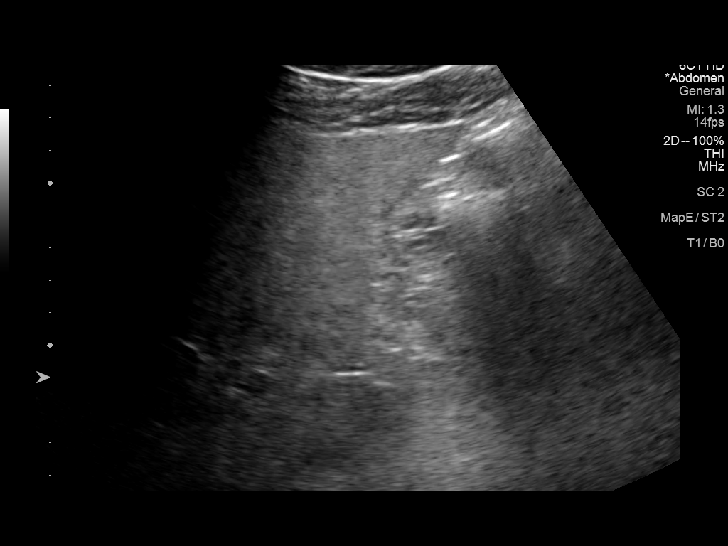
[im 31/50]
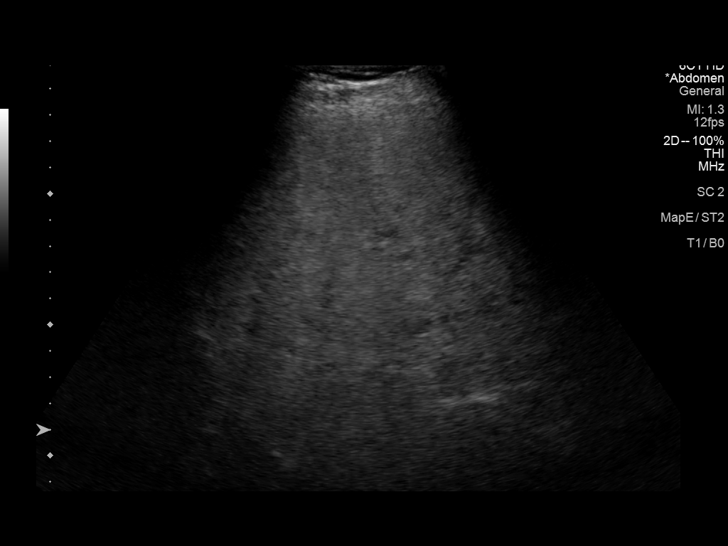
[im 33/50]
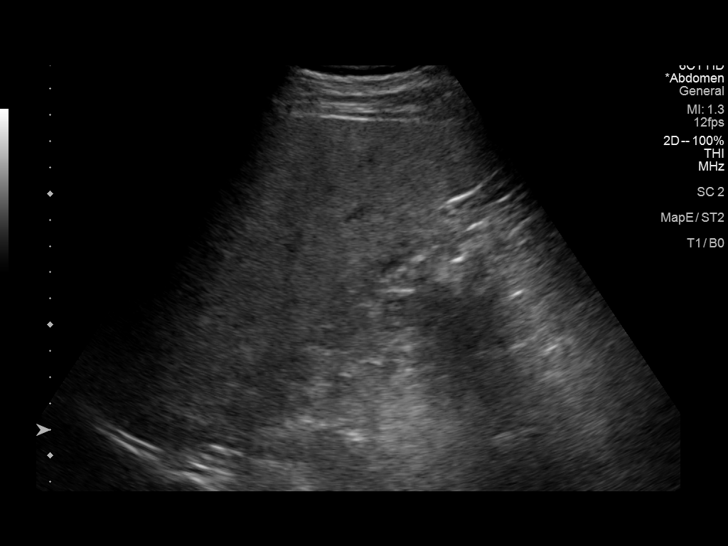
[im 37/50]
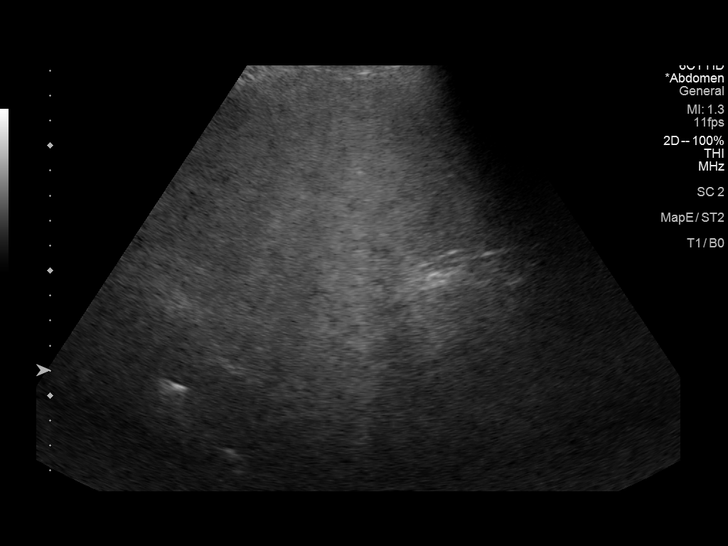
[im 41/50]
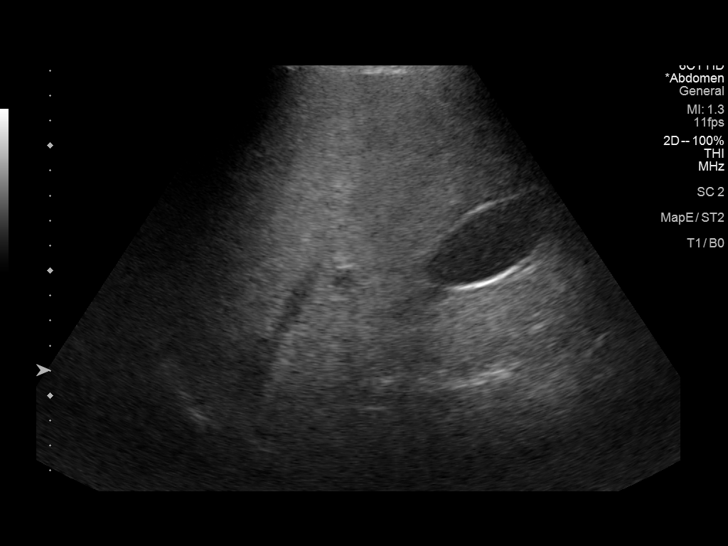
[im 45/50]
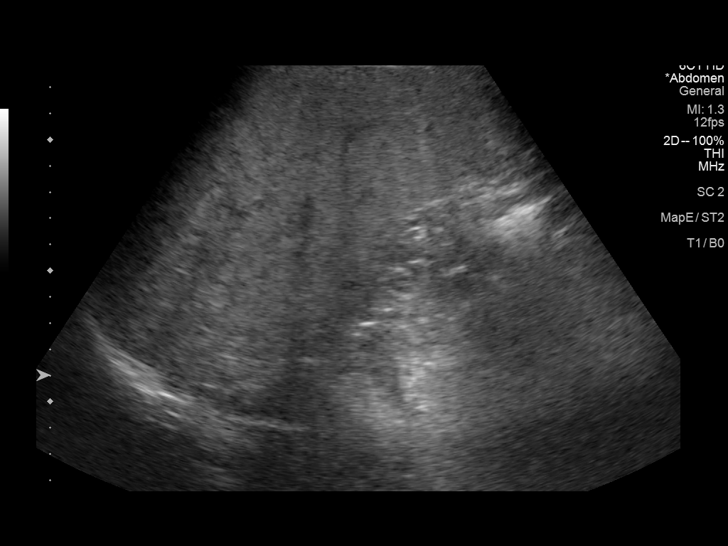
[im 50/50]
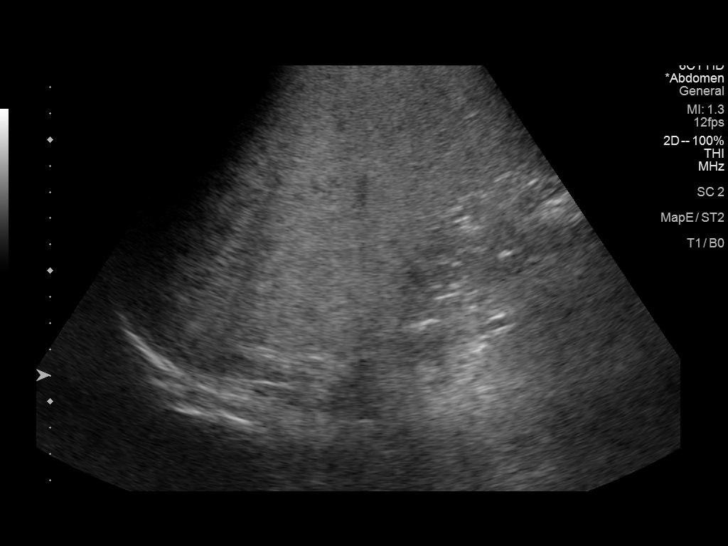

[14 of 25 positions shown; findings below may reference images not displayed]

FINDINGS: Gallbladder:

No gallstones or wall thickening visualized. No sonographic Murphy
sign noted by sonographer.

Common bile duct:

Diameter: 5 mm, within normal limits.

Liver:

No focal lesion identified. Heterogeneously increased hepatic
parenchymal echogenicity. Interrogated portal vein is patent on
color Doppler imaging with normal direction of blood flow towards
the liver.
IMPRESSION: Heterogeneously increased hepatic parenchymal echogenicity. Findings
may be seen in the setting of hepatic steatosis or other chronic
hepatic parenchymal disease. Otherwise unremarkable right upper
quadrant ultrasound, as described.

## 2021-05-25 ENCOUNTER — Emergency Department (HOSPITAL_BASED_OUTPATIENT_CLINIC_OR_DEPARTMENT_OTHER)
Admission: EM | Admit: 2021-05-25 | Discharge: 2021-05-25 | Disposition: A | Discharge status: Home / Self Care | Payer: 59 | Attending: Emergency Medicine | Admitting: Emergency Medicine

## 2021-05-25 ENCOUNTER — Encounter (HOSPITAL_BASED_OUTPATIENT_CLINIC_OR_DEPARTMENT_OTHER): Payer: Self-pay | Admitting: *Deleted

## 2021-05-25 ENCOUNTER — Other Ambulatory Visit: Payer: Self-pay

## 2021-05-25 DIAGNOSIS — I10 Essential (primary) hypertension: Secondary | ICD-10-CM | POA: Diagnosis not present

## 2021-05-25 DIAGNOSIS — S61209A Unspecified open wound of unspecified finger without damage to nail, initial encounter: Secondary | ICD-10-CM

## 2021-05-25 DIAGNOSIS — S4991XA Unspecified injury of right shoulder and upper arm, initial encounter: Secondary | ICD-10-CM | POA: Diagnosis present

## 2021-05-25 DIAGNOSIS — W274XXA Contact with kitchen utensil, initial encounter: Secondary | ICD-10-CM | POA: Diagnosis not present

## 2021-05-25 DIAGNOSIS — S61216A Laceration without foreign body of right little finger without damage to nail, initial encounter: Secondary | ICD-10-CM | POA: Insufficient documentation

## 2021-05-25 DIAGNOSIS — Z79899 Other long term (current) drug therapy: Secondary | ICD-10-CM | POA: Diagnosis not present

## 2021-05-25 DIAGNOSIS — S61214A Laceration without foreign body of right ring finger without damage to nail, initial encounter: Secondary | ICD-10-CM | POA: Insufficient documentation

## 2021-05-25 NOTE — ED Triage Notes (Signed)
Laceration to right 4th and 5th digit, was using a food slicer, bleeding is controlled, injury occurred yesterday, band aids in place by pt

## 2021-05-25 NOTE — Discharge Instructions (Addendum)
Keep the dressing on for the next 24 hours.  After that you may remove the dressing and wash it with soap and water twice daily and redress the wound. He can apply Neosporin as needed to the area, avoid any peroxide or alcohol.  Follow-up with your primary care doctor in a week for wound recheck.  If you develop any signs of infection please return back to the ED for further evaluation.  As we discussed, this could include pus, redness biting down the fingers, severe tenderness to the fingers, etc.

## 2021-05-25 NOTE — ED Provider Notes (Signed)
[facility] EMERGENCY DEPARTMENT Provider Note   CSN: [id number] Arrival date & time: 05/25/21  6433     History Chief Complaint  Patient presents with   Finger Injury   Laceration    Steve Morris is a 64 y.o. male.  HPI  Patient presents with injury to the fourth and fifth digit on his right hand.  Last night he was using a mandolin, cut those 2 fingers and tends up with an avulsion.  There is pain with palpation, but he denies any numbness or tingling in the digit.  No bony tenderness, no coldness to the fingers.  Bleeding is controlled, this happened around 5 PM last night.  He takes a baby aspirin but is not on any other blood thinners.  His tetanus is up-to-date, not diabetic.  Past Medical History:  Diagnosis Date   Anxiety    History of kidney stones    Hypercholesteremia    Hypertension    Inguinal hernia     Patient Active Problem List   Diagnosis Date Noted   Metatarsalgia of both feet 12/25/2014    Past Surgical History:  Procedure Laterality Date   EXTRACORPOREAL SHOCK WAVE LITHOTRIPSY Right 05/17/2017   Procedure: RIGHT EXTRACORPOREAL SHOCK WAVE LITHOTRIPSY (ESWL);  Surgeon: Heloise Purpura, MD;  Location: [facility];  Service: Urology;  Laterality: Right;   INGUINAL HERNIA REPAIR     NECK SURGERY     SHOULDER SURGERY Right        History reviewed. No pertinent family history.  Social History   Tobacco Use   Smoking status: Never   Smokeless tobacco: Never  Substance Use Topics   Alcohol use: Yes    Alcohol/week: 0.0 standard drinks    Comment: daily   Drug use: No    Home Medications Prior to Admission medications   Medication Sig Start Date End Date Taking? Authorizing Provider  ALPRAZolam Prudy Feeler) 0.5 MG tablet Take 0.5 mg by mouth 2 (two) times daily as needed. 12/12/14   [provider]  atorvastatin (LIPITOR) 40 MG tablet Take 40 mg by mouth daily. 10/28/14   [provider]  bisoprolol-hydrochlorothiazide (ZIAC)  5-6.25 MG per tablet Take 1 tablet by mouth daily. 10/28/14   [provider]  BuPROPion HCl (WELLBUTRIN PO) Take by mouth.    [provider]  diclofenac (VOLTAREN) 75 MG EC tablet Take 1 tablet (75 mg total) by mouth 2 (two) times daily. 09/29/17   Felecia Shelling, DPM  HYDROcodone-acetaminophen (NORCO/VICODIN) 5-325 MG tablet Take 1-2 tablets by mouth every 6 (six) hours as needed for moderate pain. 04/30/17   Vanetta Mulders, MD  lisinopril (PRINIVIL,ZESTRIL) 20 MG tablet Take 20 mg by mouth daily. 10/17/14   [provider]  ondansetron (ZOFRAN ODT) 4 MG disintegrating tablet Take 1 tablet (4 mg total) by mouth every 8 (eight) hours as needed for nausea or vomiting. 04/30/17   Vanetta Mulders, MD  ranitidine (ZANTAC) 75 MG tablet Take 75 mg by mouth 2 (two) times daily.    [provider]  valACYclovir (VALTREX) 1000 MG tablet Take 1,000 mg by mouth daily as needed. 12/11/14   [provider]    Allergies    Patient has no known allergies.  Review of Systems   Review of Systems  Constitutional:  Negative for fever.  Skin:  Positive for wound.   Physical Exam Updated Vital Signs BP (!) 146/91 (BP Location: Right Arm)   Pulse 68   Temp 98.2 F (36.8  C)   Resp 18   Ht 5\' 11"  (1.803 m)   Wt 104.3 kg   SpO2 98%   BMI 32.08 kg/m   Physical Exam Vitals and nursing note reviewed. Exam conducted with a chaperone present.  Constitutional:      General: He is not in acute distress.    Appearance: Normal appearance.  HENT:     Head: Normocephalic and atraumatic.  Eyes:     General: No scleral icterus.    Extraocular Movements: Extraocular movements intact.     Pupils: Pupils are equal, round, and reactive to light.  Cardiovascular:     Comments: Radial pulses 2+ bilaterally. Musculoskeletal:        General: Tenderness and signs of injury present. No deformity. Normal range of motion.     Comments: Please see photo and injury, he has  full range of motion of the digits, no tenderness to palpation along joints.  Bleeding is controlled.  Skin:    Capillary Refill: Capillary refill takes less than 2 seconds.     Coloration: Skin is not jaundiced.  Neurological:     Mental Status: He is alert. Mental status is at baseline.     Coordination: Coordination normal.       ED Results / Procedures / Treatments   Labs (all labs ordered are listed, but only abnormal results are displayed) Labs Reviewed - No data to display  EKG None  Radiology No results found.  Procedures Procedures   Medications Ordered in ED Medications - No data to display  ED Course  I have reviewed the triage vital signs and the nursing notes.  Pertinent labs & imaging results that were available during my care of the patient were reviewed by me and considered in my medical decision making (see chart for details).    MDM Rules/Calculators/A&P                           Patient is stable vitals, he is neurovascularly intact with avulsion to fourth and fifth digit for greater than 12 hours.  There is nothing to repair the laceration, there is no point tenderness or mechanism concerning for injury of the joints.  Cap refill is good, sensation is intact.  He is not immunocompromised or diabetic, we discussed signs of infection as well as return precautions.  Discussed wound care.  Put him in a Xeroform dressing with with plans to follow-up for wound check in 1 week.  Advised Tylenol and ibuprofen as needed for pain management.  Final Clinical Impression(s) / ED Diagnoses Final diagnoses:  None    Rx / DC Orders ED Discharge Orders     None        , PA-C 05/25/21 1546    07/25/21, MD 05/26/21 1101

## 2022-10-02 ENCOUNTER — Other Ambulatory Visit: Payer: Self-pay

## 2022-10-02 ENCOUNTER — Encounter (HOSPITAL_BASED_OUTPATIENT_CLINIC_OR_DEPARTMENT_OTHER): Payer: Self-pay | Admitting: Orthopaedic Surgery

## 2022-10-06 ENCOUNTER — Encounter (HOSPITAL_BASED_OUTPATIENT_CLINIC_OR_DEPARTMENT_OTHER)
Admission: RE | Admit: 2022-10-06 | Discharge: 2022-10-06 | Disposition: A | Discharge status: Home / Self Care | Payer: 59 | Source: Ambulatory Visit | Attending: Orthopaedic Surgery | Admitting: Orthopaedic Surgery

## 2022-10-06 DIAGNOSIS — I10 Essential (primary) hypertension: Secondary | ICD-10-CM | POA: Diagnosis not present

## 2022-10-06 DIAGNOSIS — Z01818 Encounter for other preprocedural examination: Secondary | ICD-10-CM | POA: Diagnosis present

## 2022-10-06 LAB — BASIC METABOLIC PANEL
Anion gap: 7 (ref 5–15)
BUN: 19 mg/dL (ref 8–23)
CO2: 26 mmol/L (ref 22–32)
Calcium: 9.3 mg/dL (ref 8.9–10.3)
Chloride: 104 mmol/L (ref 98–111)
Creatinine, Ser: 0.99 mg/dL (ref 0.61–1.24)
GFR, Estimated: >60 mL/min (ref >60)
Glucose, Bld: 142 mg/dL — ABNORMAL HIGH (ref 70–99)
Potassium: 4.6 mmol/L (ref 3.5–5.1)
Sodium: 137 mmol/L (ref 135–145)

## 2022-10-06 NOTE — Progress Notes (Signed)
Patient given CHG soap with written and verbal instruction. Patient verbalized understanding.

## 2022-10-11 NOTE — Discharge Instructions (Addendum)
Ramond Marrow MD, MPH Alfonse Alpers, PA-C [facility] [address] [phone] (tel)   [phone] (fax)   POST-OPERATIVE INSTRUCTIONS - Knee Arthroscopy  WOUND CARE - You may remove the Operative Dressing on Post-Op Day #3 (72hrs after surgery).   -  Alternatively if you would like you can leave dressing on until follow-up if within 7-8 days but keep it dry. - Leave steri-strips in place until they fall off on their own, usually 2 weeks postop. - An ACE wrap may be used to control swelling, do not wrap this too tight.  If the initial ACE wrap feels too tight you may loosen it. - There may be a small amount of fluid/bleeding leaking at the surgical site.  - This is normal; the knee is filled with fluid during the procedure and can leak for 24-48hrs after surgery. You may change/reinforce the bandage as needed.  - Use the Cryocuff or Ice as often as possible for the first 7 days, then as needed for pain relief. Always keep a towel, ACE wrap or other barrier between the cooling unit and your skin.  - You may shower on Post-Op Day #3. Gently pat the area dry.  - Do not soak the knee in water or submerge it.  - Do not go swimming in the pool or ocean until 4 weeks after surgery or when otherwise instructed.  Keep dry incisions as dry as possible.   BRACE/AMBULATION  -            You will not need a brace after this procedure.   - You may use crutches initially to help you weight bear, but this is not required - You can put full weight on your operative leg as you feel comfortable  PHYSICAL THERAPY - You will begin physical therapy soon after surgery (unless otherwise specified) - Please call to set up an appointment, if you do not already have one  - Let our office if there are any issues with scheduling your therapy  - You have a physical therapy appointment scheduled at [facility] (across the hall from our office) on Thursday 12/28 @ 715 am   REGIONAL  ANESTHESIA (NERVE BLOCKS) The anesthesia team may have performed a nerve block for you this is a great tool used to minimize pain.   The block may start wearing off overnight (between 8-24 hours postop) When the block wears off, your pain may go from nearly zero to the pain you would have had postop without the block. This is an abrupt transition but nothing dangerous is happening.   This can be a challenging period but utilize your as needed pain medications to try and manage this period. We suggest you use the pain medication the first night prior to going to bed, to ease this transition.  You may take an extra dose of narcotic when this happens if needed   POST-OP MEDICATIONS- Multimodal approach to pain control In general your pain will be controlled with a combination of substances.  Prescriptions unless otherwise discussed are electronically sent to your pharmacy.  This is a carefully made plan we use to minimize narcotic use.     Meloxicam - Anti-inflammatory medication taken on a scheduled basis Acetaminophen - Non-narcotic pain medicine taken on a scheduled basis  Tramadol - This is a strong narcotic, to be used only on an "as needed" basis for SEVERE pain. Aspirin 81mg  - This medicine is used to minimize the risk  of blood clots after surgery. Zofran - take as needed for nausea    FOLLOW-UP   Please call the office to schedule a follow-up appointment for your incision check, 7-10 days post-operatively.   IF YOU HAVE ANY QUESTIONS, PLEASE FEEL FREE TO CALL OUR OFFICE.   HELPFUL INFORMATION   Keep your leg elevated to decrease swelling, which will then in turn decrease your pain. I would elevate the foot of your bed by putting a couple of couch pillows between your mattress and box spring. I would not keep pillow directly under your ankle.  - Do not sleep with a pillow behind your knee even if it is more comfortable as this may make it harder to get your knee fully straight  long term.   There will be MORE swelling on days 1-3 than there is on the day of surgery.  This also is normal. The swelling will decrease with the anti-inflammatory medication, ice and keeping it elevated. The swelling will make it more difficult to bend your knee. As the swelling goes down your motion will become easier   You may develop swelling and bruising that extends from your knee down to your calf and perhaps even to your foot over the next week. Do not be alarmed. This too is normal, and it is due to gravity   There may be some numbness adjacent to the incision site. This may last for 6-12 months or longer in some patients and is expected.   You may return to sedentary work/school in the next couple of days when you feel up to it. You will need to keep your leg elevated as much as possible    You should wean off your narcotic medicines as soon as you are able.  Most patients will be off or using minimal narcotics before their first postop appointment.    We suggest you use the pain medication the first night prior to going to bed, in order to ease any pain when the anesthesia wears off. You should avoid taking pain medications on an empty stomach as it will make you nauseous.   Do not drink alcoholic beverages or take illicit drugs when taking pain medications.   It is against the law to drive while taking narcotics. You cannot drive if your Right leg is in brace locked in extension.   Pain medication may make you constipated.  Below are a few solutions to try in this order:  o Decrease the amount of pain medication if you aren't having pain.  o Drink lots of decaffeinated fluids.  o Drink prune juice and/or eat dried prunes   o If the first 3 don't work start with additional solutions  o Take Colace - an over-the-counter stool softener  o Take Senokot - an over-the-counter laxative  o Take Miralax - a stronger over-the-counter laxative    For more information  including helpful videos and documents visit our website:   [email or web address]    Post Anesthesia Home Care Instructions  Activity: Get plenty of rest for the remainder of the day. A responsible individual must stay with you for 24 hours following the procedure.  For the next 24 hours, DO NOT: -Drive a car -Paediatric nurse -Drink alcoholic beverages -Take any medication unless instructed by your physician -Make any legal decisions or sign important papers.  Meals: Start with liquid foods such as gelatin or soup. Progress to regular foods as tolerated. Avoid greasy, spicy, heavy foods. If nausea and/or vomiting occur,  drink only clear liquids until the nausea and/or vomiting subsides. Call your physician if vomiting continues.  Special Instructions/Symptoms: Your throat may feel dry or sore from the anesthesia or the breathing tube placed in your throat during surgery. If this causes discomfort, gargle with warm salt water. The discomfort should disappear within 24 hours.  If you had a scopolamine patch placed behind your ear for the management of post- operative nausea and/or vomiting:  1. The medication in the patch is effective for 72 hours, after which it should be removed.  Wrap patch in a tissue and discard in the trash. Wash hands thoroughly with soap and water. 2. You may remove the patch earlier than 72 hours if you experience unpleasant side effects which may include dry mouth, dizziness or visual disturbances. 3. Avoid touching the patch. Wash your hands with soap and water after contact with the patch.

## 2022-10-11 NOTE — H&P (Signed)
PREOPERATIVE H&P  Chief Complaint: right knee meniscus tear  HPI: Steve Morris is a 66 y.o. male who is scheduled for, Procedure(s): KNEE ARTHROSCOPY WITH MEDIAL MENISECTOMY.   Patient has a past medical history significant for GERD, HTN.   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  No specific injury.  Pain with twisting and turning activities. He has not had any improvement with non-operative measures.   Symptoms are rated as moderate to severe, and have been worsening.  This is significantly impairing activities of daily living.    Please see clinic note for further details on this patient's care.    He has elected for surgical management.   Past Medical History:  Diagnosis Date   Anxiety    GERD (gastroesophageal reflux disease)    History of kidney stones    Hypercholesteremia    Hypertension    Inguinal hernia    Past Surgical History:  Procedure Laterality Date   EXTRACORPOREAL SHOCK WAVE LITHOTRIPSY Right 05/17/2017   Procedure: RIGHT EXTRACORPOREAL SHOCK WAVE LITHOTRIPSY (ESWL);  Surgeon: Heloise Purpura, MD;  Location: [facility];  Service: Urology;  Laterality: Right;   INGUINAL HERNIA REPAIR     NECK SURGERY     SHOULDER SURGERY Right    Social History   Socioeconomic History   Marital status: Married    Spouse name: Not on file   Number of children: Not on file   Years of education: Not on file   Highest education level: Not on file  Occupational History   Not on file  Tobacco Use   Smoking status: Never   Smokeless tobacco: Never  Substance and Sexual Activity   Alcohol use: Yes    Alcohol/week: 0.0 standard drinks of alcohol    Comment: daily   Drug use: No   Sexual activity: Not on file  Other Topics Concern   Not on file  Social History Narrative   Not on file   Social Determinants of Health   Financial Resource  Strain: Not on file  Food Insecurity: Not on file  Transportation Needs: Not on file  Physical Activity: Not on file  Stress: Not on file  Social Connections: Not on file   History reviewed. No pertinent family history. No Known Allergies Prior to Admission medications   Medication Sig Start Date End Date Taking? Authorizing Provider  ALPRAZolam Prudy Feeler) 0.5 MG tablet Take 0.5 mg by mouth 2 (two) times daily as needed. 12/12/14  Yes [provider]  aspirin EC 81 MG tablet Take 81 mg by mouth daily. Swallow whole.   Yes [provider]  atorvastatin (LIPITOR) 40 MG tablet Take 40 mg by mouth daily. 10/28/14  Yes [provider]  bisoprolol-hydrochlorothiazide (ZIAC) 5-6.25 MG per tablet Take 1 tablet by mouth daily. 10/28/14  Yes [provider]  BuPROPion HCl (WELLBUTRIN PO) Take by mouth.   Yes [provider]  lisinopril (PRINIVIL,ZESTRIL) 20 MG tablet Take 20 mg by mouth daily. 10/17/14  Yes [provider]  Multiple Vitamin (MULTIVITAMIN WITH MINERALS) TABS tablet Take 1 tablet by mouth daily.   Yes [provider]  Omega-3 Fatty Acids (FISH OIL) 1000 MG CAPS Take by mouth.   Yes [provider]  ranitidine (ZANTAC) 75 MG tablet Take 75 mg by mouth 2 (two) times daily.   Yes [provider]  valACYclovir (VALTREX) 1000 MG tablet Take 1,000 mg by mouth daily as needed. 12/11/14  Yes [provider]    ROS: All other systems have been reviewed and were otherwise negative with the exception of those mentioned in the HPI and as above.  Physical Exam: General: Alert, no acute distress Cardiovascular: No pedal edema Respiratory: No cyanosis, no use of accessory musculature GI: No organomegaly, abdomen is soft and non-tender Skin: No lesions in the area of chief complaint Neurologic: Sensation intact distally Psychiatric: Patient is competent for consent with normal mood and affect Lymphatic: No  axillary or cervical lymphadenopathy  MUSCULOSKELETAL:  On examination he is tender to palpation over the medial joint line. Positive McMurray.  Imaging: MRI demonstrates some bone edema in the tibia. 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  Assessment: right knee meniscus tear  Plan: Plan for Procedure(s): KNEE ARTHROSCOPY WITH MEDIAL MENISECTOMY  The risks benefits and alternatives were discussed with the patient including but not limited to the risks of nonoperative treatment, versus surgical intervention including infection, bleeding, nerve injury,  blood clots, cardiopulmonary complications, morbidity, mortality, among others, and they were willing to proceed.   The patient acknowledged the explanation, agreed to proceed with the plan and consent was signed.   Operative Plan: Right knee scope with medial meniscectomy   Discharge Medications: Standard DVT Prophylaxis: Aspirin Physical Therapy: Outpatient PT Special Discharge needs: +/-   Vernetta Honey, PA-C  10/11/2022 1:54 PM

## 2022-10-13 ENCOUNTER — Other Ambulatory Visit: Payer: Self-pay

## 2022-10-13 ENCOUNTER — Ambulatory Visit (HOSPITAL_BASED_OUTPATIENT_CLINIC_OR_DEPARTMENT_OTHER)
Admission: RE | Admit: 2022-10-13 | Discharge: 2022-10-13 | Disposition: A | Discharge status: Home / Self Care | Payer: 59 | Attending: Orthopaedic Surgery | Admitting: Orthopaedic Surgery

## 2022-10-13 ENCOUNTER — Ambulatory Visit (HOSPITAL_BASED_OUTPATIENT_CLINIC_OR_DEPARTMENT_OTHER): Payer: 59 | Admitting: Certified Registered"

## 2022-10-13 ENCOUNTER — Encounter (HOSPITAL_BASED_OUTPATIENT_CLINIC_OR_DEPARTMENT_OTHER): Payer: Self-pay | Admitting: Orthopaedic Surgery

## 2022-10-13 ENCOUNTER — Encounter (HOSPITAL_BASED_OUTPATIENT_CLINIC_OR_DEPARTMENT_OTHER)
Admission: RE | Disposition: A | Discharge status: Home / Self Care | Payer: Self-pay | Source: Home / Self Care | Attending: Orthopaedic Surgery

## 2022-10-13 DIAGNOSIS — K219 Gastro-esophageal reflux disease without esophagitis: Secondary | ICD-10-CM | POA: Diagnosis not present

## 2022-10-13 DIAGNOSIS — X58XXXA Exposure to other specified factors, initial encounter: Secondary | ICD-10-CM | POA: Insufficient documentation

## 2022-10-13 DIAGNOSIS — I10 Essential (primary) hypertension: Secondary | ICD-10-CM | POA: Diagnosis not present

## 2022-10-13 DIAGNOSIS — M1711 Unilateral primary osteoarthritis, right knee: Secondary | ICD-10-CM | POA: Insufficient documentation

## 2022-10-13 DIAGNOSIS — S83231A Complex tear of medial meniscus, current injury, right knee, initial encounter: Secondary | ICD-10-CM | POA: Insufficient documentation

## 2022-10-13 DIAGNOSIS — S83241A Other tear of medial meniscus, current injury, right knee, initial encounter: Secondary | ICD-10-CM | POA: Diagnosis not present

## 2022-10-13 HISTORY — PX: KNEE ARTHROSCOPY WITH MEDIAL MENISECTOMY: SHX5651

## 2022-10-13 HISTORY — DX: Gastro-esophageal reflux disease without esophagitis: K21.9

## 2022-10-13 SURGERY — ARTHROSCOPY, KNEE, WITH MEDIAL MENISCECTOMY
Anesthesia: General | Site: Knee | Laterality: Right

## 2022-10-13 MED ORDER — FENTANYL CITRATE (PF) 100 MCG/2ML IJ SOLN: 25.0000 ug | INTRAMUSCULAR | Status: DC | PRN

## 2022-10-13 MED ORDER — LIDOCAINE 2% (20 MG/ML) 5 ML SYRINGE
INTRAMUSCULAR | Status: AC
  Filled 2022-10-13: qty 5

## 2022-10-13 MED ORDER — BUPIVACAINE HCL (PF) 0.25 % IJ SOLN
INTRAMUSCULAR | Status: AC
  Filled 2022-10-13: qty 30

## 2022-10-13 MED ORDER — PROPOFOL 10 MG/ML IV BOLUS
INTRAVENOUS | Status: AC
  Filled 2022-10-13: qty 20

## 2022-10-13 MED ORDER — MIDAZOLAM HCL 2 MG/2ML IJ SOLN
INTRAMUSCULAR | Status: AC
  Filled 2022-10-13: qty 2

## 2022-10-13 MED ORDER — ATROPINE SULFATE 0.4 MG/ML IV SOLN
INTRAVENOUS | Status: AC
  Filled 2022-10-13: qty 1

## 2022-10-13 MED ORDER — ACETAMINOPHEN 500 MG PO TABS
1000.0000 mg | ORAL_TABLET | Freq: Once | ORAL | Status: AC
  Administered 2022-10-13: 1000 mg via ORAL

## 2022-10-13 MED ORDER — GABAPENTIN 300 MG PO CAPS
300.0000 mg | ORAL_CAPSULE | Freq: Once | ORAL | Status: AC
  Administered 2022-10-13: 300 mg via ORAL

## 2022-10-13 MED ORDER — OXYCODONE HCL 5 MG PO TABS: 5.0000 mg | ORAL_TABLET | Freq: Once | ORAL | Status: DC | PRN

## 2022-10-13 MED ORDER — FENTANYL CITRATE (PF) 100 MCG/2ML IJ SOLN
INTRAMUSCULAR | Status: AC
  Filled 2022-10-13: qty 2

## 2022-10-13 MED ORDER — SODIUM CHLORIDE 0.9 % IR SOLN
Status: DC | PRN
  Administered 2022-10-13: 3000 mL

## 2022-10-13 MED ORDER — LIDOCAINE HCL (CARDIAC) PF 100 MG/5ML IV SOSY
PREFILLED_SYRINGE | INTRAVENOUS | Status: DC | PRN
  Administered 2022-10-13: 60 mg via INTRAVENOUS

## 2022-10-13 MED ORDER — MELOXICAM 15 MG PO TABS: 15.0000 mg | ORAL_TABLET | Freq: Every day | ORAL | 0 refills | Status: AC

## 2022-10-13 MED ORDER — BUPIVACAINE HCL (PF) 0.5 % IJ SOLN
INTRAMUSCULAR | Status: AC
  Filled 2022-10-13: qty 30

## 2022-10-13 MED ORDER — BUPIVACAINE HCL (PF) 0.25 % IJ SOLN
INTRAMUSCULAR | Status: DC | PRN
  Administered 2022-10-13: 20 mL

## 2022-10-13 MED ORDER — CEFAZOLIN SODIUM-DEXTROSE 2-4 GM/100ML-% IV SOLN
2.0000 g | INTRAVENOUS | Status: AC
  Administered 2022-10-13: 2 g via INTRAVENOUS

## 2022-10-13 MED ORDER — PROPOFOL 10 MG/ML IV BOLUS
INTRAVENOUS | Status: DC | PRN
  Administered 2022-10-13: 200 mg via INTRAVENOUS

## 2022-10-13 MED ORDER — ACETAMINOPHEN 500 MG PO TABS
ORAL_TABLET | ORAL | Status: AC
  Filled 2022-10-13: qty 2

## 2022-10-13 MED ORDER — GABAPENTIN 300 MG PO CAPS
ORAL_CAPSULE | ORAL | Status: AC
  Filled 2022-10-13: qty 1

## 2022-10-13 MED ORDER — TRAMADOL HCL 50 MG PO TABS: 50.0000 mg | ORAL_TABLET | Freq: Four times a day (QID) | ORAL | 0 refills | Status: DC | PRN

## 2022-10-13 MED ORDER — KETOROLAC TROMETHAMINE 30 MG/ML IJ SOLN: 30.0000 mg | Freq: Once | INTRAMUSCULAR | Status: DC | PRN

## 2022-10-13 MED ORDER — LACTATED RINGERS IV SOLN: INTRAVENOUS | Status: DC

## 2022-10-13 MED ORDER — PROMETHAZINE HCL 25 MG/ML IJ SOLN: 6.2500 mg | INTRAMUSCULAR | Status: DC | PRN

## 2022-10-13 MED ORDER — ASPIRIN 81 MG PO CHEW: 81.0000 mg | CHEWABLE_TABLET | Freq: Two times a day (BID) | ORAL | 0 refills | Status: AC

## 2022-10-13 MED ORDER — OXYCODONE HCL 5 MG/5ML PO SOLN: 5.0000 mg | Freq: Once | ORAL | Status: DC | PRN

## 2022-10-13 MED ORDER — MIDAZOLAM HCL 5 MG/5ML IJ SOLN
INTRAMUSCULAR | Status: DC | PRN
  Administered 2022-10-13: 2 mg via INTRAVENOUS

## 2022-10-13 MED ORDER — VANCOMYCIN HCL 1000 MG IV SOLR
INTRAVENOUS | Status: AC
  Filled 2022-10-13: qty 20

## 2022-10-13 MED ORDER — ONDANSETRON HCL 4 MG PO TABS: 4.0000 mg | ORAL_TABLET | Freq: Three times a day (TID) | ORAL | 0 refills | Status: AC | PRN

## 2022-10-13 MED ORDER — DEXAMETHASONE SODIUM PHOSPHATE 10 MG/ML IJ SOLN
INTRAMUSCULAR | Status: DC | PRN
  Administered 2022-10-13: 5 mg via INTRAVENOUS

## 2022-10-13 MED ORDER — EPINEPHRINE PF 1 MG/ML IJ SOLN
INTRAMUSCULAR | Status: AC
  Filled 2022-10-13: qty 1

## 2022-10-13 MED ORDER — AMISULPRIDE (ANTIEMETIC) 5 MG/2ML IV SOLN: 10.0000 mg | Freq: Once | INTRAVENOUS | Status: DC | PRN

## 2022-10-13 MED ORDER — ONDANSETRON HCL 4 MG/2ML IJ SOLN
INTRAMUSCULAR | Status: AC
  Filled 2022-10-13: qty 2

## 2022-10-13 MED ORDER — CEFAZOLIN SODIUM-DEXTROSE 2-4 GM/100ML-% IV SOLN
INTRAVENOUS | Status: AC
  Filled 2022-10-13: qty 100

## 2022-10-13 MED ORDER — DEXAMETHASONE SODIUM PHOSPHATE 10 MG/ML IJ SOLN
INTRAMUSCULAR | Status: AC
  Filled 2022-10-13: qty 1

## 2022-10-13 MED ORDER — ONDANSETRON HCL 4 MG/2ML IJ SOLN
INTRAMUSCULAR | Status: DC | PRN
  Administered 2022-10-13: 4 mg via INTRAVENOUS

## 2022-10-13 MED ORDER — FENTANYL CITRATE (PF) 100 MCG/2ML IJ SOLN
INTRAMUSCULAR | Status: DC | PRN
  Administered 2022-10-13: 25 ug via INTRAVENOUS
  Administered 2022-10-13: 50 ug via INTRAVENOUS
  Administered 2022-10-13: 25 ug via INTRAVENOUS

## 2022-10-13 MED ORDER — ACETAMINOPHEN 500 MG PO TABS: 1000.0000 mg | ORAL_TABLET | Freq: Three times a day (TID) | ORAL | 0 refills | Status: AC

## 2022-10-13 SURGICAL SUPPLY — 32 items
APL PRP STRL LF DISP 70% ISPRP (MISCELLANEOUS) ×1
BANDAGE ESMARK 6X9 LF (GAUZE/BANDAGES/DRESSINGS) IMPLANT
BNDG CMPR 9X6 STRL LF SNTH (GAUZE/BANDAGES/DRESSINGS)
BNDG ELASTIC 6X5.8 VLCR STR LF (GAUZE/BANDAGES/DRESSINGS) ×2 IMPLANT
BNDG ESMARK 6X9 LF (GAUZE/BANDAGES/DRESSINGS)
CHLORAPREP W/TINT 26 (MISCELLANEOUS) ×2 IMPLANT
CLSR STERI-STRIP ANTIMIC 1/2X4 (GAUZE/BANDAGES/DRESSINGS) ×2 IMPLANT
CUFF TOURN SGL QUICK 34 (TOURNIQUET CUFF) ×1
CUFF TRNQT CYL 34X4.125X (TOURNIQUET CUFF) ×2 IMPLANT
DISSECTOR 4.0MMX13CM CVD (MISCELLANEOUS) ×2 IMPLANT
DRAPE ARTHROSCOPY W/POUCH 90 (DRAPES) ×2 IMPLANT
DRAPE IMP U-DRAPE 54X76 (DRAPES) ×2 IMPLANT
DRAPE U-SHAPE 47X51 STRL (DRAPES) ×2 IMPLANT
GAUZE SPONGE 4X4 12PLY STRL (GAUZE/BANDAGES/DRESSINGS) ×2 IMPLANT
GLOVE BIO SURGEON STRL SZ 6.5 (GLOVE) ×2 IMPLANT
GLOVE BIOGEL PI IND STRL 6.5 (GLOVE) ×2 IMPLANT
GLOVE BIOGEL PI IND STRL 8 (GLOVE) ×2 IMPLANT
GLOVE ECLIPSE 8.0 STRL XLNG CF (GLOVE) ×4 IMPLANT
GOWN STRL REUS W/ TWL LRG LVL3 (GOWN DISPOSABLE) ×2 IMPLANT
GOWN STRL REUS W/TWL LRG LVL3 (GOWN DISPOSABLE) ×1
GOWN STRL REUS W/TWL XL LVL3 (GOWN DISPOSABLE) ×2 IMPLANT
KIT TURNOVER KIT B (KITS) ×2 IMPLANT
MANIFOLD NEPTUNE II (INSTRUMENTS) IMPLANT
NS IRRIG 1000ML POUR BTL (IV SOLUTION) IMPLANT
PACK ARTHROSCOPY DSU (CUSTOM PROCEDURE TRAY) ×2 IMPLANT
PORT APPOLLO RF 90DEGREE MULTI (SURGICAL WAND) IMPLANT
SLEEVE SCD COMPRESS KNEE MED (STOCKING) ×2 IMPLANT
SUT MNCRL AB 4-0 PS2 18 (SUTURE) ×2 IMPLANT
TOWEL GREEN STERILE FF (TOWEL DISPOSABLE) ×2 IMPLANT
TUBE CONNECTING 20X1/4 (TUBING) ×2 IMPLANT
TUBING ARTHROSCOPY IRRIG 16FT (MISCELLANEOUS) ×2 IMPLANT
WATER STERILE IRR 1000ML POUR (IV SOLUTION) ×2 IMPLANT

## 2022-10-13 NOTE — Transfer of Care (Signed)
Immediate Anesthesia Transfer of Care Note  Patient: Steve Morris  Procedure(s) Performed: KNEE ARTHROSCOPY WITH MEDIAL MENISECTOMY (Right: Knee)  Patient Location: PACU  Anesthesia Type:General  Level of Consciousness: awake, alert , and oriented  Airway & Oxygen Therapy: Patient Spontanous Breathing and Patient connected to face mask oxygen  Post-op Assessment: Report given to RN and Post -op Vital signs reviewed and stable  Post vital signs: Reviewed and stable  Last Vitals:  Vitals Value Taken Time  BP 135/91 10/13/22 0757  Temp    Pulse 83 10/13/22 0759  Resp 18 10/13/22 0759  SpO2 97 % 10/13/22 0759  Vitals shown include unvalidated device data.  Last Pain:  Vitals:   10/13/22 0635  TempSrc: Oral  PainSc: 1       Patients Stated Pain Goal: 7 (10/13/22 1937)  Complications: No notable events documented.

## 2022-10-13 NOTE — Anesthesia Procedure Notes (Signed)
Procedure Name: LMA Insertion Date/Time: 10/13/2022 7:22 AM  Performed by: Lauralyn Primes, CRNAPre-anesthesia Checklist: Patient identified, Emergency Drugs available, Suction available and Patient being monitored Patient Re-evaluated:Patient Re-evaluated prior to induction Oxygen Delivery Method: Circle system utilized Preoxygenation: Pre-oxygenation with 100% oxygen Induction Type: IV induction Ventilation: Mask ventilation without difficulty LMA: LMA inserted LMA Size: 5.0 Number of attempts: 1 Airway Equipment and Method: Bite block Placement Confirmation: positive ETCO2 Tube secured with: Tape Dental Injury: Teeth and Oropharynx as per pre-operative assessment

## 2022-10-13 NOTE — Interval H&P Note (Signed)
All questions answered, patient wants to proceed with procedure. ? ?

## 2022-10-13 NOTE — Anesthesia Postprocedure Evaluation (Signed)
Anesthesia Post Note  Patient: Steve Morris  Procedure(s) Performed: KNEE ARTHROSCOPY WITH MEDIAL MENISECTOMY (Right: Knee)     Patient location during evaluation: PACU Anesthesia Type: General Level of consciousness: awake Pain management: pain level controlled Vital Signs Assessment: post-procedure vital signs reviewed and stable Respiratory status: spontaneous breathing, nonlabored ventilation and respiratory function stable Cardiovascular status: blood pressure returned to baseline and stable Postop Assessment: no apparent nausea or vomiting Anesthetic complications: no   No notable events documented.  Last Vitals:  Vitals:   10/13/22 0815 10/13/22 0833  BP: (!) 140/91 (!) 151/92  Pulse: 75 75  Resp: 18 16  Temp:  36.5 C  SpO2: 93% 95%    Last Pain:  Vitals:   10/13/22 0833  TempSrc:   PainSc: 2                  Jaideep Pollack P Rae Sutcliffe

## 2022-10-13 NOTE — Anesthesia Preprocedure Evaluation (Addendum)
Anesthesia Evaluation  Patient identified by MRN, date of birth, ID band Patient awake    Reviewed: Allergy & Precautions, NPO status , Patient's Chart, lab work & pertinent test results  Airway Mallampati: II  TM Distance: >3 FB Neck ROM: Full    Dental no notable dental hx.    Pulmonary neg pulmonary ROS   Pulmonary exam normal        Cardiovascular hypertension, Pt. on home beta blockers and Pt. on medications Normal cardiovascular exam     Neuro/Psych   Anxiety     negative neurological ROS     GI/Hepatic negative GI ROS, Neg liver ROS,,,  Endo/Other  negative endocrine ROS    Renal/GU negative Renal ROS     Musculoskeletal negative musculoskeletal ROS (+)    Abdominal   Peds  Hematology negative hematology ROS (+)   Anesthesia Other Findings right knee meniscus tear  Reproductive/Obstetrics                             Anesthesia Physical Anesthesia Plan  ASA: 2  Anesthesia Plan: General   Post-op Pain Management:    Induction: Intravenous  PONV Risk Score and Plan: 2 and Ondansetron, Dexamethasone, Midazolam and Treatment may vary due to age or medical condition  Airway Management Planned: LMA  Additional Equipment:   Intra-op Plan:   Post-operative Plan: Extubation in OR  Informed Consent: I have reviewed the patients History and Physical, chart, labs and discussed the procedure including the risks, benefits and alternatives for the proposed anesthesia with the patient or authorized representative who has indicated his/her understanding and acceptance.     Dental advisory given  Plan Discussed with: CRNA  Anesthesia Plan Comments:        Anesthesia Quick Evaluation

## 2022-10-13 NOTE — Op Note (Signed)
Orthopaedic Surgery Operative Note (CSN: [id number])  Steve Morris  Oct 29, 1955 Date of Surgery: 10/13/2022   Diagnoses:  Right knee medial meniscus tear  Procedure: Right medial meniscus debridement   Operative Finding Full motion no limitation no instability.  Patient had grade 4 small areas of change to the distal femur as well as small area of medial tibia.  There were diffuse grade 3 changes.  There is complex degenerative medial meniscus tear this debrided back, 40% total meniscal volume resected.  Patellofemoral compartment had some mild softening and grade 2 changes however the lateral compartment was normal.  Patient would be a candidate for unicompartmental arthroplasty if he needs further surgery.  Successful completion of the planned procedure.    Post-operative plan: The patient will be full weightbearing.  The patient will be charged home.  DVT prophylaxis Aspirin 81 mg twice daily for 6 weeks.  Pain control with PRN pain medication preferring oral medicines.  Follow up plan will be scheduled in approximately 7 days for incision check.  Post-Op Diagnosis: Same Surgeons:Primary: Bjorn Pippin, MD Assistants:Caroline McBane PA-C Location: [facility] OR ROOM 6 Anesthesia: General with local Antibiotics: Ancef 2 g Tourniquet time:  Estimated Blood Loss: Minimal Complications: None Specimens: None Implants: * No implants in log *  Indications for Surgery:   Steve Morris is a 66 y.o. male with medial meniscus tear and arthritis, mild on MRI.  Benefits and risks of operative and nonoperative management were discussed prior to surgery with patient/guardian(s) and informed consent form was completed.  Specific risks including infection, need for additional surgery, continued pain, post meniscectomy syndrome and need for arthroplasty amongst others   Procedure:   The patient was identified properly. Informed consent was obtained and the surgical site was marked. The patient was  taken up to suite where general anesthesia was induced. The patient was placed in the supine position with a post against the surgical leg and a nonsterile tourniquet applied. The surgical leg was then prepped and draped usual sterile fashion.  A standard surgical timeout was performed.  2 standard anterior portals were made and diagnostic arthroscopy performed. Please note the findings as noted above.  Using a shaver and basket we debrided back the medial meniscus and the cartilage to a stable base.  Incisions closed with absorbable suture. The patient was awoken from general anesthesia and taken to the PACU in stable condition without complication.   Alfonse Alpers, PA-C, present and scrubbed throughout the case, critical for completion in a timely fashion, and for retraction, instrumentation, closure.

## 2022-10-14 ENCOUNTER — Encounter (HOSPITAL_BASED_OUTPATIENT_CLINIC_OR_DEPARTMENT_OTHER): Payer: Self-pay | Admitting: Orthopaedic Surgery

## 2023-07-13 DIAGNOSIS — H5203 Hypermetropia, bilateral: Secondary | ICD-10-CM | POA: Diagnosis not present

## 2023-07-13 DIAGNOSIS — I10 Essential (primary) hypertension: Secondary | ICD-10-CM | POA: Diagnosis not present

## 2023-07-13 DIAGNOSIS — H04123 Dry eye syndrome of bilateral lacrimal glands: Secondary | ICD-10-CM | POA: Diagnosis not present

## 2023-07-22 DIAGNOSIS — M25511 Pain in right shoulder: Secondary | ICD-10-CM | POA: Diagnosis not present

## 2023-10-07 DIAGNOSIS — M25561 Pain in right knee: Secondary | ICD-10-CM | POA: Diagnosis not present

## 2023-10-07 DIAGNOSIS — M1711 Unilateral primary osteoarthritis, right knee: Secondary | ICD-10-CM | POA: Diagnosis not present

## 2023-10-29 HISTORY — PX: TOTAL KNEE ARTHROPLASTY: SHX125

## 2023-12-10 DIAGNOSIS — Z1159 Encounter for screening for other viral diseases: Secondary | ICD-10-CM | POA: Diagnosis not present

## 2024-04-27 DIAGNOSIS — I10 Essential (primary) hypertension: Secondary | ICD-10-CM | POA: Diagnosis not present

## 2024-04-27 DIAGNOSIS — E669 Obesity, unspecified: Secondary | ICD-10-CM | POA: Diagnosis not present

## 2024-04-27 DIAGNOSIS — K429 Umbilical hernia without obstruction or gangrene: Secondary | ICD-10-CM | POA: Diagnosis not present

## 2024-05-10 DIAGNOSIS — K429 Umbilical hernia without obstruction or gangrene: Secondary | ICD-10-CM | POA: Diagnosis not present

## 2024-05-17 ENCOUNTER — Ambulatory Visit: Payer: Self-pay | Admitting: General Surgery

## 2024-06-02 NOTE — Progress Notes (Signed)
 Surgical Instructions   Your procedure is scheduled on Friday June 09, 2024. Report to [facility] Main Entrance A at 5:30 A.M., then check in with the Admitting office. Any questions or running late day of surgery: call [phone]  Questions prior to your surgery date: call [phone], Monday-Friday, 8am-4pm. If you experience any cold or flu symptoms such as cough, fever, chills, shortness of breath, etc. between now and your scheduled surgery, please notify us  at the above number.     Remember:  Do not eat or drink after midnight the night before your surgery  Take these medicines the morning of surgery with A SIP OF WATER  atorvastatin (LIPITOR)  buPROPion (WELLBUTRIN XL)   May take these medicines IF NEEDED: ALPRAZolam (XANAX)  valACYclovir (VALTREX)   Follow your surgeon's instructions on when to stop Asprin.  If no instructions were given by your surgeon then you will need to call the office to get those instructions.    One week prior to surgery, STOP taking any Aleve, Naproxen, Ibuprofen, Motrin, Advil, Goody's, BC's, all herbal medications, fish oil, and non-prescription vitamins. This includes your meloxicam  (MOBIC ).                       Do NOT Smoke (Tobacco/Vaping) for 24 hours prior to your procedure.  If you use a CPAP at night, you may bring your mask/headgear for your overnight stay.   You will be asked to remove any contacts, glasses, piercing's, hearing aid's, dentures/partials prior to surgery. Please bring cases for these items if needed.    Patients discharged the day of surgery will not be allowed to drive home, and someone needs to stay with them for 24 hours.  SURGICAL WAITING ROOM VISITATION Patients may have no more than 2 support people in the waiting area - these visitors may rotate.   Pre-op nurse will coordinate an appropriate time for 1 ADULT support person, who may not rotate, to accompany patient in pre-op.  Children under the age of 39  must have an adult with them who is not the patient and must remain in the main waiting area with an adult.  If the patient needs to stay at the hospital during part of their recovery, the visitor guidelines for inpatient rooms apply.  Please refer to the [facility] website for the visitor guidelines for any additional information.   If you received a COVID test during your pre-op visit  it is requested that you wear a mask when out in public, stay away from anyone that may not be feeling well and notify your surgeon if you develop symptoms. If you have been in contact with anyone that has tested positive in the last 10 days please notify you surgeon.      Pre-operative CHG Bathing Instructions   You can play a key role in reducing the risk of infection after surgery. Your skin needs to be as free of germs as possible. You can reduce the number of germs on your skin by washing with CHG (chlorhexidine  gluconate) soap before surgery. CHG is an antiseptic soap that kills germs and continues to kill germs even after washing.   DO NOT use if you have an allergy to chlorhexidine /CHG or antibacterial soaps. If your skin becomes reddened or irritated, stop using the CHG and notify one of our RNs at [phone].              TAKE A SHOWER THE NIGHT BEFORE SURGERY  AND THE DAY OF SURGERY    Please keep in mind the following:  You may shave your face before/day of surgery.  Place clean sheets on your bed the night before surgery Use a clean washcloth (not used since being washed) for each shower. DO NOT sleep with pet's night before surgery.  CHG Shower Instructions:  Wash your face and private area with normal soap. If you choose to wash your hair, wash first with your normal shampoo.  After you use shampoo/soap, rinse your hair and body thoroughly to remove shampoo/soap residue.  Turn the water OFF and apply half the bottle of CHG soap to a CLEAN washcloth.  Apply CHG soap ONLY FROM YOUR NECK  DOWN TO YOUR TOES (washing for 3-5 minutes)  DO NOT use CHG soap on face, private areas, open wounds, or sores.  Pay special attention to the area where your surgery is being performed.  If you are having back surgery, having someone wash your back for you may be helpful. Wait 2 minutes after CHG soap is applied, then you may rinse off the CHG soap.  Pat dry with a clean towel  Put on clean pajamas    Additional instructions for the day of surgery: DO NOT APPLY any lotions, deodorants or cologne.   Do not wear jewelry  Do not wear nail polish, gel polish, artificial nails, or any other type of covering on natural nails (fingers and toes) Do not bring valuables to the hospital. [facility] is not responsible for valuables/personal belongings. Put on clean/comfortable clothes.  Please brush your teeth.  Ask your nurse before applying any prescription medications to the skin.

## 2024-06-05 ENCOUNTER — Other Ambulatory Visit: Payer: Self-pay

## 2024-06-05 ENCOUNTER — Encounter (HOSPITAL_COMMUNITY)
Admission: RE | Admit: 2024-06-05 | Discharge: 2024-06-05 | Disposition: A | Discharge status: Home / Self Care | Source: Ambulatory Visit | Attending: General Surgery | Admitting: General Surgery

## 2024-06-05 ENCOUNTER — Encounter (HOSPITAL_COMMUNITY): Payer: Self-pay

## 2024-06-05 VITALS — BP 119/83 | HR 77 | Temp 98.4°F | Resp 18 | Ht 71.0 in | Wt 228.9 lb

## 2024-06-05 DIAGNOSIS — Z01818 Encounter for other preprocedural examination: Secondary | ICD-10-CM | POA: Diagnosis not present

## 2024-06-05 LAB — BASIC METABOLIC PANEL WITH GFR
Anion gap: 12 (ref 5–15)
BUN: 18 mg/dL (ref 8–23)
CO2: 24 mmol/L (ref 22–32)
Calcium: 9.5 mg/dL (ref 8.9–10.3)
Chloride: 99 mmol/L (ref 98–111)
Creatinine, Ser: 1.1 mg/dL (ref 0.61–1.24)
GFR, Estimated: >60 mL/min (ref >60)
Glucose, Bld: 96 mg/dL (ref 70–99)
Potassium: 4 mmol/L (ref 3.5–5.1)
Sodium: 135 mmol/L (ref 135–145)

## 2024-06-05 LAB — CBC
HCT: 43.3 % (ref 39.0–52.0)
Hemoglobin: 15 g/dL (ref 13.0–17.0)
MCH: 33.3 pg (ref 26.0–34.0)
MCHC: 34.6 g/dL (ref 30.0–36.0)
MCV: 96.2 fL (ref 80.0–100.0)
Platelets: 176 K/uL (ref 150–400)
RBC: 4.5 MIL/uL (ref 4.22–5.81)
RDW: 12.1 % (ref 11.5–15.5)
WBC: 8.1 K/uL (ref 4.0–10.5)
nRBC: 0 % (ref 0.0–0.2)

## 2024-06-05 NOTE — Progress Notes (Signed)
 PCP - Dr. Joen Gentry Cardiologist -   PPM/ICD - denies Device Orders - na Rep Notified - na  Chest x-ray - na EKG - PAT, 06/05/2024 Stress Test -  ECHO -  Cardiac Cath -   Sleep Study - denies CPAP - na  Non-diabetic  Blood Thinner Instructions: denies Aspirin  Instructions: states last dose 05/31/2024  ERAS Protcol - NPO  Anesthesia review: No  Patient denies shortness of breath, fever, cough and chest pain at PAT appointment   All instructions explained to the patient, with a verbal understanding of the material. Patient agrees to go over the instructions while at home for a better understanding. Patient also instructed to self quarantine after being tested for COVID-19. The opportunity to ask questions was provided.

## 2024-06-09 ENCOUNTER — Ambulatory Visit (HOSPITAL_COMMUNITY): Admitting: Registered Nurse

## 2024-06-09 ENCOUNTER — Encounter (HOSPITAL_COMMUNITY)
Admission: RE | Disposition: A | Discharge status: Home / Self Care | Payer: Self-pay | Source: Home / Self Care | Attending: General Surgery

## 2024-06-09 ENCOUNTER — Ambulatory Visit (HOSPITAL_COMMUNITY)
Admission: RE | Admit: 2024-06-09 | Discharge: 2024-06-09 | Disposition: A | Discharge status: Home / Self Care | Payer: Self-pay | Attending: General Surgery | Admitting: General Surgery

## 2024-06-09 ENCOUNTER — Other Ambulatory Visit: Payer: Self-pay

## 2024-06-09 DIAGNOSIS — K429 Umbilical hernia without obstruction or gangrene: Secondary | ICD-10-CM | POA: Diagnosis not present

## 2024-06-09 DIAGNOSIS — Z79899 Other long term (current) drug therapy: Secondary | ICD-10-CM | POA: Insufficient documentation

## 2024-06-09 DIAGNOSIS — I10 Essential (primary) hypertension: Secondary | ICD-10-CM | POA: Diagnosis not present

## 2024-06-09 HISTORY — PX: UMBILICAL HERNIA REPAIR: SHX196

## 2024-06-09 SURGERY — REPAIR, HERNIA, UMBILICAL, ADULT
Anesthesia: General | Site: Abdomen

## 2024-06-09 MED ORDER — SODIUM CHLORIDE 0.9 % IV SOLN: 12.5000 mg | INTRAVENOUS | Status: DC | PRN

## 2024-06-09 MED ORDER — FENTANYL CITRATE (PF) 250 MCG/5ML IJ SOLN
INTRAMUSCULAR | Status: DC | PRN
  Administered 2024-06-09: 50 ug via INTRAVENOUS
  Administered 2024-06-09: 100 ug via INTRAVENOUS

## 2024-06-09 MED ORDER — GABAPENTIN 300 MG PO CAPS
ORAL_CAPSULE | ORAL | Status: AC
  Administered 2024-06-09: 300 mg via ORAL
  Filled 2024-06-09: qty 1

## 2024-06-09 MED ORDER — OXYCODONE HCL 5 MG PO TABS: 5.0000 mg | ORAL_TABLET | Freq: Once | ORAL | Status: DC | PRN

## 2024-06-09 MED ORDER — 0.9 % SODIUM CHLORIDE (POUR BTL) OPTIME
TOPICAL | Status: DC | PRN
Start: 2024-06-09 — End: 2024-06-09
  Administered 2024-06-09: 800 mL

## 2024-06-09 MED ORDER — CHLORHEXIDINE GLUCONATE CLOTH 2 % EX PADS: 6.0000 | MEDICATED_PAD | Freq: Once | CUTANEOUS | Status: DC

## 2024-06-09 MED ORDER — DEXAMETHASONE SODIUM PHOSPHATE 10 MG/ML IJ SOLN
INTRAMUSCULAR | Status: AC
  Filled 2024-06-09: qty 1

## 2024-06-09 MED ORDER — BUPIVACAINE-EPINEPHRINE (PF) 0.25% -1:200000 IJ SOLN
INTRAMUSCULAR | Status: AC
  Filled 2024-06-09: qty 30

## 2024-06-09 MED ORDER — HYDROMORPHONE HCL 2 MG PO TABS: 2.0000 mg | ORAL_TABLET | ORAL | 0 refills | Status: AC | PRN

## 2024-06-09 MED ORDER — CEFAZOLIN SODIUM-DEXTROSE 2-4 GM/100ML-% IV SOLN
INTRAVENOUS | Status: AC
  Filled 2024-06-09: qty 100

## 2024-06-09 MED ORDER — HYDROMORPHONE HCL 1 MG/ML IJ SOLN: 0.2500 mg | INTRAMUSCULAR | Status: DC | PRN

## 2024-06-09 MED ORDER — LIDOCAINE 2% (20 MG/ML) 5 ML SYRINGE
INTRAMUSCULAR | Status: AC
  Filled 2024-06-09: qty 5

## 2024-06-09 MED ORDER — SUGAMMADEX SODIUM 200 MG/2ML IV SOLN
INTRAVENOUS | Status: DC | PRN
  Administered 2024-06-09: 200 mg via INTRAVENOUS

## 2024-06-09 MED ORDER — BUPIVACAINE-EPINEPHRINE 0.25% -1:200000 IJ SOLN
INTRAMUSCULAR | Status: DC | PRN
  Administered 2024-06-09: 30 mL

## 2024-06-09 MED ORDER — ACETAMINOPHEN 500 MG PO TABS
ORAL_TABLET | ORAL | Status: AC
  Administered 2024-06-09: 1000 mg via ORAL
  Filled 2024-06-09: qty 2

## 2024-06-09 MED ORDER — LIDOCAINE 2% (20 MG/ML) 5 ML SYRINGE
INTRAMUSCULAR | Status: DC | PRN
  Administered 2024-06-09: 100 mg via INTRAVENOUS

## 2024-06-09 MED ORDER — CHLORHEXIDINE GLUCONATE 0.12 % MT SOLN: 15.0000 mL | Freq: Once | OROMUCOSAL | Status: AC

## 2024-06-09 MED ORDER — MEPERIDINE HCL 25 MG/ML IJ SOLN: 6.2500 mg | INTRAMUSCULAR | Status: DC | PRN

## 2024-06-09 MED ORDER — MIDAZOLAM HCL 2 MG/2ML IJ SOLN
INTRAMUSCULAR | Status: DC | PRN
  Administered 2024-06-09: 2 mg via INTRAVENOUS

## 2024-06-09 MED ORDER — ENOXAPARIN SODIUM 40 MG/0.4ML IJ SOSY: 40.0000 mg | PREFILLED_SYRINGE | Freq: Once | INTRAMUSCULAR | Status: AC

## 2024-06-09 MED ORDER — AMISULPRIDE (ANTIEMETIC) 5 MG/2ML IV SOLN: 10.0000 mg | Freq: Once | INTRAVENOUS | Status: DC | PRN

## 2024-06-09 MED ORDER — ROCURONIUM BROMIDE 10 MG/ML (PF) SYRINGE
PREFILLED_SYRINGE | INTRAVENOUS | Status: AC
  Filled 2024-06-09: qty 10

## 2024-06-09 MED ORDER — ENOXAPARIN SODIUM 40 MG/0.4ML IJ SOSY
PREFILLED_SYRINGE | INTRAMUSCULAR | Status: AC
  Administered 2024-06-09: 40 mg via SUBCUTANEOUS
  Filled 2024-06-09: qty 0.4

## 2024-06-09 MED ORDER — ONDANSETRON HCL 4 MG/2ML IJ SOLN
INTRAMUSCULAR | Status: AC
  Filled 2024-06-09: qty 2

## 2024-06-09 MED ORDER — PROPOFOL 10 MG/ML IV BOLUS
INTRAVENOUS | Status: AC
  Filled 2024-06-09: qty 20

## 2024-06-09 MED ORDER — ONDANSETRON HCL 4 MG/2ML IJ SOLN
INTRAMUSCULAR | Status: DC | PRN
  Administered 2024-06-09: 4 mg via INTRAVENOUS

## 2024-06-09 MED ORDER — CHLORHEXIDINE GLUCONATE 0.12 % MT SOLN
OROMUCOSAL | Status: AC
  Administered 2024-06-09: 15 mL via OROMUCOSAL
  Filled 2024-06-09: qty 15

## 2024-06-09 MED ORDER — GABAPENTIN 300 MG PO CAPS: 300.0000 mg | ORAL_CAPSULE | ORAL | Status: AC

## 2024-06-09 MED ORDER — ORAL CARE MOUTH RINSE: 15.0000 mL | Freq: Once | OROMUCOSAL | Status: AC

## 2024-06-09 MED ORDER — LACTATED RINGERS IV SOLN: INTRAVENOUS | Status: DC

## 2024-06-09 MED ORDER — PROPOFOL 10 MG/ML IV BOLUS
INTRAVENOUS | Status: DC | PRN
  Administered 2024-06-09: 150 mg via INTRAVENOUS

## 2024-06-09 MED ORDER — CEFAZOLIN SODIUM-DEXTROSE 2-4 GM/100ML-% IV SOLN
2.0000 g | INTRAVENOUS | Status: AC
  Administered 2024-06-09: 2 g via INTRAVENOUS

## 2024-06-09 MED ORDER — ACETAMINOPHEN 500 MG PO TABS: 1000.0000 mg | ORAL_TABLET | ORAL | Status: AC

## 2024-06-09 MED ORDER — ROCURONIUM BROMIDE 10 MG/ML (PF) SYRINGE
PREFILLED_SYRINGE | INTRAVENOUS | Status: DC | PRN
  Administered 2024-06-09: 50 mg via INTRAVENOUS

## 2024-06-09 MED ORDER — MIDAZOLAM HCL 2 MG/2ML IJ SOLN
INTRAMUSCULAR | Status: AC
  Filled 2024-06-09: qty 2

## 2024-06-09 MED ORDER — PHENYLEPHRINE HCL-NACL 20-0.9 MG/250ML-% IV SOLN
INTRAVENOUS | Status: AC
  Filled 2024-06-09: qty 250

## 2024-06-09 MED ORDER — PHENYLEPHRINE 80 MCG/ML (10ML) SYRINGE FOR IV PUSH (FOR BLOOD PRESSURE SUPPORT)
PREFILLED_SYRINGE | INTRAVENOUS | Status: AC
  Filled 2024-06-09: qty 10

## 2024-06-09 MED ORDER — DEXAMETHASONE SODIUM PHOSPHATE 10 MG/ML IJ SOLN
INTRAMUSCULAR | Status: DC | PRN
  Administered 2024-06-09: 10 mg via INTRAVENOUS

## 2024-06-09 MED ORDER — FENTANYL CITRATE (PF) 250 MCG/5ML IJ SOLN
INTRAMUSCULAR | Status: AC
  Filled 2024-06-09: qty 5

## 2024-06-09 MED ORDER — OXYCODONE HCL 5 MG/5ML PO SOLN: 5.0000 mg | Freq: Once | ORAL | Status: DC | PRN

## 2024-06-09 SURGICAL SUPPLY — 29 items
BAG COUNTER SPONGE SURGICOUNT (BAG) ×2 IMPLANT
BLADE CLIPPER SURG (BLADE) IMPLANT
CANISTER SUCTION 3000ML PPV (SUCTIONS) IMPLANT
CHLORAPREP W/TINT 26 (MISCELLANEOUS) ×2 IMPLANT
COTTONBALL LRG STERILE PKG (GAUZE/BANDAGES/DRESSINGS) ×2 IMPLANT
COVER SURGICAL LIGHT HANDLE (MISCELLANEOUS) ×2 IMPLANT
DERMABOND ADVANCED .7 DNX12 (GAUZE/BANDAGES/DRESSINGS) ×2 IMPLANT
DRAPE LAPAROTOMY 100X72 PEDS (DRAPES) ×2 IMPLANT
ELECTRODE REM PT RTRN 9FT ADLT (ELECTROSURGICAL) ×2 IMPLANT
GAUZE 4X4 16PLY ~~LOC~~+RFID DBL (SPONGE) ×2 IMPLANT
GAUZE SPONGE 2X2 8PLY STRL LF (GAUZE/BANDAGES/DRESSINGS) ×2 IMPLANT
GLOVE BIO SURGEON STRL SZ 6 (GLOVE) ×2 IMPLANT
GLOVE INDICATOR 6.5 STRL GRN (GLOVE) ×2 IMPLANT
GOWN STRL REUS W/ TWL LRG LVL3 (GOWN DISPOSABLE) ×4 IMPLANT
KIT BASIN OR (CUSTOM PROCEDURE TRAY) ×2 IMPLANT
KIT TURNOVER KIT B (KITS) ×2 IMPLANT
NDL HYPO 25GX1X1/2 BEV (NEEDLE) ×2 IMPLANT
NEEDLE HYPO 25GX1X1/2 BEV (NEEDLE) ×1 IMPLANT
NS IRRIG 1000ML POUR BTL (IV SOLUTION) ×2 IMPLANT
PACK GENERAL/GYN (CUSTOM PROCEDURE TRAY) ×2 IMPLANT
PAD ARMBOARD POSITIONER FOAM (MISCELLANEOUS) ×2 IMPLANT
PENCIL SMOKE EVACUATOR (MISCELLANEOUS) ×2 IMPLANT
SUT ETHIBOND 0 MO6 C/R (SUTURE) ×2 IMPLANT
SUT MNCRL AB 4-0 PS2 18 (SUTURE) ×2 IMPLANT
SUT PDS PLUS AB 0 CT-2 (SUTURE) IMPLANT
SUT VIC AB 3-0 SH 27X BRD (SUTURE) ×2 IMPLANT
SYR CONTROL 10ML LL (SYRINGE) ×2 IMPLANT
TOWEL GREEN STERILE (TOWEL DISPOSABLE) ×2 IMPLANT
TOWEL GREEN STERILE FF (TOWEL DISPOSABLE) ×2 IMPLANT

## 2024-06-09 NOTE — Transfer of Care (Signed)
 Immediate Anesthesia Transfer of Care Note  Patient: Steve Morris  Procedure(s) Performed: REPAIR, HERNIA, UMBILICAL, ADULT (Abdomen)  Patient Location: PACU  Anesthesia Type:General  Level of Consciousness: awake, alert , and oriented  Airway & Oxygen Therapy: Patient Spontanous Breathing and Patient connected to face mask oxygen  Post-op Assessment: Report given to RN and Post -op Vital signs reviewed and stable  Post vital signs: Reviewed and stable  Last Vitals:  Vitals Value Taken Time  BP 130/91 06/09/24 08:47  Temp    Pulse 95 06/09/24 08:49  Resp 22 06/09/24 08:49  SpO2 94 % 06/09/24 08:49  Vitals shown include unfiled device data.  Last Pain:  Vitals:   06/09/24 0654  TempSrc:   PainSc: 0-No pain         Complications: No notable events documented.

## 2024-06-09 NOTE — Anesthesia Procedure Notes (Signed)
 Procedure Name: Intubation Date/Time: 06/09/2024 7:41 AM  Performed by: Lanning Cena RAMAN, CRNAPre-anesthesia Checklist: Patient identified, Suction available, Emergency Drugs available, Patient being monitored and Timeout performed Patient Re-evaluated:Patient Re-evaluated prior to induction Oxygen Delivery Method: Circle system utilized Preoxygenation: Pre-oxygenation with 100% oxygen Induction Type: IV induction Ventilation: Mask ventilation without difficulty Laryngoscope Size: Miller and 2 Grade View: Grade II Tube type: Oral Tube size: 7.5 mm Number of attempts: 1 Airway Equipment and Method: Stylet Placement Confirmation: ETT inserted through vocal cords under direct vision, positive ETCO2, breath sounds checked- equal and bilateral and CO2 detector Secured at: 23 cm Tube secured with: Tape Dental Injury: Teeth and Oropharynx as per pre-operative assessment

## 2024-06-09 NOTE — Op Note (Signed)
 Post-Op Note/Post-Procedure Note  Patient: Steve Morris MRN: [id number] DOB: 11-Feb-1956 Sex: male Operation/Procedure Date: 06/09/2024 Surgeons and Role:    DEWAINE Ann Fine, MD - Primary Pre-operative Diagnoses: UMBILICAL HERNIA Postoperative Diagnoses:Umbilical hernia Procedure:    REPAIR, HERNIA, UMBILICAL, ADULT CPT(R) Code:  50408 - PR RPR AA HERNIA 1ST < 3 CM REDUCIBLE   Anesthesia: General endotracheal anesthesia  Indications: [name] is a 68 y.o. year old male who presents for umbilical hernia repair. Preoperatively, I discussed in detail the risks, benefits, alternatives, and potential complications. The patient understands and requests to proceed.  Operative Findings: Umbilical hernia with fascial defect of 0.5 cm (craniocaudal) x 1cm (transverse)  Technique: The patient was positively identified and was taken to the operating room and placed supine on the operating table. A time-out was performed confirming correct patient and procedure. We also confirmed initiation of deep venous thrombosis prophylaxis and wound prophylaxis. After successful induction of general endotracheal anesthesia,the abdomen was prepped and draped in the usual sterile surgical fashion. We began by injecting 0.25% marcaine  with epi inferior to the umbilicus following planned path of incision. Using a #15 blade we then made a curvilinear incision inferior to the umbilicus. Using blunt dissection and electrocautery we soon encountered the hernia sac. The hernia sac was dissected free from the subcutaneous attachments circumferentially down to the rim of the fascial defect. We were able to maintain a preperitoneal plane circumferentially. The dissected peritoneum was developed  and the hernia sac easily as reduced. We ensured hemostasis after this dissection. The patient had fairly healthy fascia circumferentially. This hernia defect was approximately 1cm in size. We ensured hemostasis in the  preperitoneal space. Given the small size of the hernia defect, decision made not to place a piece of mesh. The fascia was closed using interrupted 0-PDS suture in a figure of eight fashion after abrading the edges with the Bovie. We then approximated the redundant skin at the umbilicus to the anterior fascia using 3-0 Vicryl suture in a cosmetically appealing fashion. Hemostasis was observed. We injected the remaining local anesthetic at the fascia and in the subcutaneous tissues. The skin was closed using interrupted 3-0 Vicryl deep dermal sutures followed by a running 4-0 Monocryl subcuticular suture. Dermabond was applied.   The patient tolerated the procedure well and was extubated and taken to recovery room in good condition. All sponge, needle, and instrument counts were reported correct at the end of the case.  Estimated Blood Loss: Minimal. Specimens:None Implants: None Drains: None. Complications: None Condition of the patient: Good, extubated Disposition: PACU

## 2024-06-09 NOTE — Anesthesia Preprocedure Evaluation (Signed)
 Anesthesia Evaluation  Patient identified by MRN, date of birth, ID band Patient awake    Reviewed: Allergy & Precautions, NPO status , Patient's Chart, lab work & pertinent test results  Airway Mallampati: II  TM Distance: >3 FB Neck ROM: Full    Dental no notable dental hx.    Pulmonary neg pulmonary ROS   Pulmonary exam normal        Cardiovascular hypertension, Pt. on home beta blockers and Pt. on medications Normal cardiovascular exam     Neuro/Psych   Anxiety     negative neurological ROS     GI/Hepatic Neg liver ROS,GERD  ,,  Endo/Other  negative endocrine ROS    Renal/GU negative Renal ROS     Musculoskeletal negative musculoskeletal ROS (+)    Abdominal  (+) + obese  Peds  Hematology negative hematology ROS (+)   Anesthesia Other Findings   Reproductive/Obstetrics                              Anesthesia Physical Anesthesia Plan  ASA: 2  Anesthesia Plan: General   Post-op Pain Management: Tylenol  PO (pre-op)* and Gabapentin  PO (pre-op)*   Induction: Intravenous  PONV Risk Score and Plan: 2 and Ondansetron , Midazolam  and Treatment may vary due to age or medical condition  Airway Management Planned: Oral ETT  Additional Equipment:   Intra-op Plan:   Post-operative Plan: Extubation in OR  Informed Consent: I have reviewed the patients History and Physical, chart, labs and discussed the procedure including the risks, benefits and alternatives for the proposed anesthesia with the patient or authorized representative who has indicated his/her understanding and acceptance.     Dental advisory given  Plan Discussed with: CRNA  Anesthesia Plan Comments:         Anesthesia Quick Evaluation

## 2024-06-09 NOTE — Anesthesia Postprocedure Evaluation (Signed)
 Anesthesia Post Note  Patient: Steve Morris  Procedure(s) Performed: REPAIR, HERNIA, UMBILICAL, ADULT (Abdomen)     Patient location during evaluation: PACU Anesthesia Type: General Level of consciousness: awake and alert Pain management: pain level controlled Vital Signs Assessment: post-procedure vital signs reviewed and stable Respiratory status: spontaneous breathing, nonlabored ventilation and respiratory function stable Cardiovascular status: blood pressure returned to baseline and stable Postop Assessment: no apparent nausea or vomiting Anesthetic complications: no   No notable events documented.  Last Vitals:  Vitals:   06/09/24 0915 06/09/24 0919  BP: 129/86 (!) 139/90  Pulse: 83 83  Resp: 19 14  Temp:  36.6 C  SpO2: 93% 93%    Last Pain:  Vitals:   06/09/24 0915  TempSrc:   PainSc: 0-No pain                 Butler Levander Pinal

## 2024-06-09 NOTE — H&P (Signed)
    HPI  Steve Morris is an 68 y.o. male who was seen in clinic on 05/10/24 for umbilical hernia.  Patient first noted hernia 8-9 months ago. Patient did have an episode several weeks ago where he had an acute bulge that causes discomfort but he was able to push it back in when he laid down. No obstructive symptoms. No overlying skin changes. No drainage. No fevers/chills.  Has history of remote open inguinal hernia repair.  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 He states he would be able to have lighter duties after surgery that did no not require physical labor.   10 point review of systems is negative except as listed above in HPI.  Objective  Past Medical History: Past Medical History:  Diagnosis Date   Anxiety    GERD (gastroesophageal reflux disease)    History of kidney stones    Hypercholesteremia    Hypertension    Inguinal hernia     Past Surgical History: Past Surgical History:  Procedure Laterality Date   EXTRACORPOREAL SHOCK WAVE LITHOTRIPSY Right 05/17/2017   Procedure: RIGHT EXTRACORPOREAL SHOCK WAVE LITHOTRIPSY (ESWL);  Surgeon: Renda Glance, MD;  Location: [facility];  Service: Urology;  Laterality: Right;   INGUINAL HERNIA REPAIR Right    KNEE ARTHROSCOPY WITH MEDIAL MENISECTOMY Right 10/13/2022   Procedure: KNEE ARTHROSCOPY WITH MEDIAL MENISECTOMY;  Surgeon: Cristy Bonner DASEN, MD;  Location: [facility];  Service: Orthopedics;  Laterality: Right;   NECK SURGERY     SHOULDER SURGERY Right    TONSILLECTOMY     TOTAL KNEE ARTHROPLASTY Right 10/29/2023    Family History:  No family history on file.  Social History:  reports that he has never smoked. He has never used smokeless tobacco. He reports current alcohol use of about 7.0 standard drinks of alcohol per week. He reports that he does not use drugs.  Allergies: No Known Allergies  Medications: I have reviewed the patient's current  medications.  Labs: Pertinent lab work personally reviewed.  Imaging: Pertinent imaging personally reviewed  Physical Exam Blood pressure 137/87, pulse 69, temperature 98.1 F (36.7 C), temperature source Oral, resp. rate 17, height 5' 11 (1.803 m), weight 103.4 kg, SpO2 95%. General: No acute distress, well appearing HEENT: PERRL, hearing grossly normal, mucous membranes moist CV: Regular rate and rhythm Pulm: Normal work of breathing on room air Abd: Soft, nontender, nondistended, Small umbilical hernia, fascial defect ~1-1.5cm. Fat containing and easily reducible. Extremities: Warm and well perfused Neuro: A&O x4, no focal neurologic deficits Psych: Appropriate mood and effect     Assessment   Steve Morris is an 68 y.o. male with umbilical hernia  Plan  - Proceed to OR for open umbilical hernia repair with possible mesh - We discussed risks of surgery including but not limited to: bleeding, infection, post op seroma, post-op hematoma, damage to surrounding structures, recurrence, pain. We discussed that if defect < 2cm then would perform primary repair since a hernia smaller than 2cm would require making the defect larger in order to place the mesh. If 2cm or greater, then will place mesh. Patient voiced understanding and wishes to proceed with surgery.     Orie Silversmith, MD [facility]

## 2024-06-12 ENCOUNTER — Encounter (HOSPITAL_COMMUNITY): Payer: Self-pay | Admitting: General Surgery

## 2024-07-06 DIAGNOSIS — Z4889 Encounter for other specified surgical aftercare: Secondary | ICD-10-CM | POA: Diagnosis not present
